# Patient Record
Sex: Male | Born: 1970 | Race: Black or African American | Hispanic: No | Marital: Single | Smoking: Never smoker
Health system: Southern US, Community
[De-identification: ages and names within clinical notes are randomized; demographics above are authoritative.]

## PROBLEM LIST (undated history)

## (undated) DIAGNOSIS — W3400XA Accidental discharge from unspecified firearms or gun, initial encounter: Secondary | ICD-10-CM

## (undated) DIAGNOSIS — F4310 Post-traumatic stress disorder, unspecified: Secondary | ICD-10-CM

## (undated) DIAGNOSIS — R29818 Other symptoms and signs involving the nervous system: Secondary | ICD-10-CM

## (undated) HISTORY — PX: NO PAST SURGERIES: SHX2092

---

## 1998-10-14 ENCOUNTER — Emergency Department (HOSPITAL_COMMUNITY): Admission: EM | Admit: 1998-10-14 | Discharge: 1998-10-15 | Payer: Self-pay | Admitting: Emergency Medicine

## 1999-02-18 ENCOUNTER — Emergency Department (HOSPITAL_COMMUNITY): Admission: EM | Admit: 1999-02-18 | Discharge: 1999-02-19 | Payer: Self-pay | Admitting: Emergency Medicine

## 1999-02-20 ENCOUNTER — Emergency Department (HOSPITAL_COMMUNITY): Admission: EM | Admit: 1999-02-20 | Discharge: 1999-02-21 | Payer: Self-pay | Admitting: Emergency Medicine

## 1999-02-21 ENCOUNTER — Encounter: Payer: Self-pay | Admitting: Emergency Medicine

## 1999-02-21 ENCOUNTER — Emergency Department (HOSPITAL_COMMUNITY): Admission: EM | Admit: 1999-02-21 | Discharge: 1999-02-21 | Payer: Self-pay

## 2009-06-26 ENCOUNTER — Inpatient Hospital Stay (HOSPITAL_COMMUNITY): Admission: EM | Admit: 2009-06-26 | Discharge: 2009-06-28 | Payer: Self-pay | Admitting: Emergency Medicine

## 2010-07-13 IMAGING — CR DG ABD PORTABLE 1V
1 series · 1 of 1 positions shown · non-contrast
Comparison: None

CLINICAL DATA: Overdose.

ABDOMEN - 1 VIEW

[view not recorded]
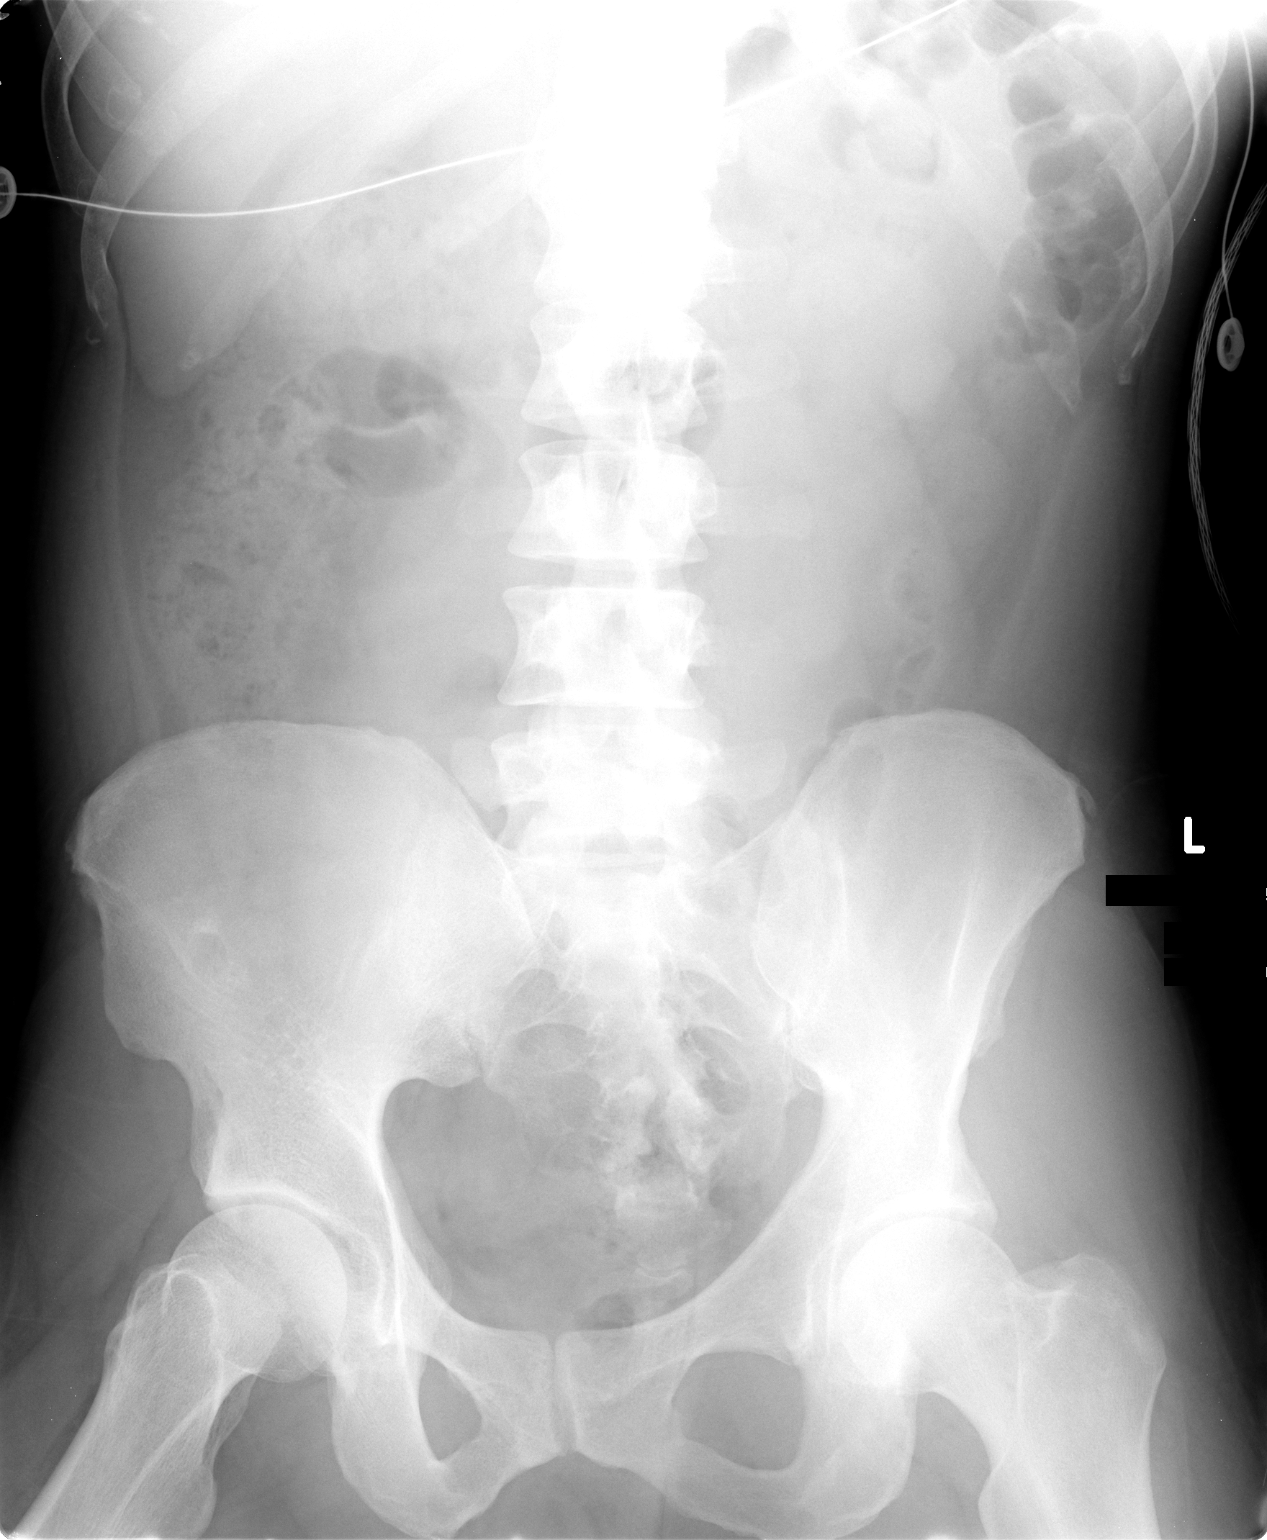

[1 of 1 positions shown; findings below may reference images not displayed]

FINDINGS: There is a normal bowel gas pattern.  No radiopaque
densities in the abdomen.  No supine evidence of free air.  No
organomegaly or suspicious calcification.
IMPRESSION: No acute findings.  No radiopaque foreign bodies visualized.

## 2011-02-06 LAB — BLOOD GAS, ARTERIAL
Acid-Base Excess: 0.5 mmol/L (ref 0.0–2.0)
Acid-base deficit: 0.8 mmol/L (ref 0.0–2.0)
Bicarbonate: 23.5 mEq/L (ref 20.0–24.0)
Bicarbonate: 25.1 mEq/L — ABNORMAL HIGH (ref 20.0–24.0)
Bicarbonate: 25.3 mEq/L — ABNORMAL HIGH (ref 20.0–24.0)
Bicarbonate: 26.2 mEq/L — ABNORMAL HIGH (ref 20.0–24.0)
Drawn by: 129801
Drawn by: 317771
FIO2: 0.21 %
FIO2: 0.21 %
O2 Saturation: 92.6 %
O2 Saturation: 92.8 %
Patient temperature: 98.6
Patient temperature: 98.6
Patient temperature: 98.6
TCO2: 22.5 mmol/L (ref 0–100)
TCO2: 23.7 mmol/L (ref 0–100)
pCO2 arterial: 43.5 mmHg (ref 35.0–45.0)
pCO2 arterial: 43.7 mmHg (ref 35.0–45.0)
pH, Arterial: 7.315 — ABNORMAL LOW (ref 7.350–7.450)
pH, Arterial: 7.38 (ref 7.350–7.450)
pO2, Arterial: 103 mmHg — ABNORMAL HIGH (ref 80.0–100.0)
pO2, Arterial: 76.2 mmHg — ABNORMAL LOW (ref 80.0–100.0)

## 2011-02-06 LAB — POCT I-STAT, CHEM 8
BUN: 9 mg/dL (ref 6–23)
Calcium, Ion: 1.12 mmol/L (ref 1.12–1.32)
Chloride: 102 mEq/L (ref 96–112)
Glucose, Bld: 152 mg/dL — ABNORMAL HIGH (ref 70–99)
HCT: 47 % (ref 39.0–52.0)
Potassium: 3 mEq/L — ABNORMAL LOW (ref 3.5–5.1)

## 2011-02-06 LAB — ACETAMINOPHEN LEVEL: Acetaminophen (Tylenol), Serum: <10 ug/mL — ABNORMAL LOW (ref 10–30)

## 2011-02-06 LAB — ETHANOL: Alcohol, Ethyl (B): <5 mg/dL (ref 0–10)

## 2011-02-06 LAB — COMPREHENSIVE METABOLIC PANEL
ALT: 52 U/L (ref 0–53)
AST: 24 U/L (ref 0–37)
Albumin: 3.4 g/dL — ABNORMAL LOW (ref 3.5–5.2)
Albumin: 4.5 g/dL (ref 3.5–5.2)
Alkaline Phosphatase: 48 U/L (ref 39–117)
BUN: 6 mg/dL (ref 6–23)
BUN: 8 mg/dL (ref 6–23)
BUN: 9 mg/dL (ref 6–23)
CO2: 27 mEq/L (ref 19–32)
Calcium: 8.7 mg/dL (ref 8.4–10.5)
Calcium: 9.2 mg/dL (ref 8.4–10.5)
Chloride: 102 mEq/L (ref 96–112)
Chloride: 108 mEq/L (ref 96–112)
Creatinine, Ser: 1 mg/dL (ref 0.4–1.5)
Creatinine, Ser: 1.07 mg/dL (ref 0.4–1.5)
GFR calc Af Amer: >60 mL/min (ref >60)
GFR calc non Af Amer: >60 mL/min (ref >60)
Glucose, Bld: 101 mg/dL — ABNORMAL HIGH (ref 70–99)
Glucose, Bld: 155 mg/dL — ABNORMAL HIGH (ref 70–99)
Glucose, Bld: 157 mg/dL — ABNORMAL HIGH (ref 70–99)
Potassium: 4 mEq/L (ref 3.5–5.1)
Sodium: 136 mEq/L (ref 135–145)
Total Bilirubin: 1.3 mg/dL — ABNORMAL HIGH (ref 0.3–1.2)
Total Protein: 7.2 g/dL (ref 6.0–8.3)
Total Protein: 8 g/dL (ref 6.0–8.3)

## 2011-02-06 LAB — CBC
HCT: 40.3 % (ref 39.0–52.0)
HCT: 42.7 % (ref 39.0–52.0)
Hemoglobin: 12.5 g/dL — ABNORMAL LOW (ref 13.0–17.0)
Hemoglobin: 13.5 g/dL (ref 13.0–17.0)
Hemoglobin: 14.2 g/dL (ref 13.0–17.0)
MCHC: 33.2 g/dL (ref 30.0–36.0)
MCHC: 33.4 g/dL (ref 30.0–36.0)
MCV: 87.1 fL (ref 78.0–100.0)
Platelets: 231 10*3/uL (ref 150–400)
Platelets: 310 10*3/uL (ref 150–400)
RBC: 4.9 MIL/uL (ref 4.22–5.81)
RDW: 14.8 % (ref 11.5–15.5)
RDW: 15.1 % (ref 11.5–15.5)
RDW: 15.2 % (ref 11.5–15.5)
WBC: 15 10*3/uL — ABNORMAL HIGH (ref 4.0–10.5)

## 2011-02-06 LAB — PROTIME-INR
INR: 1 (ref 0.00–1.49)
INR: 1.1 (ref 0.00–1.49)
Prothrombin Time: 12.9 seconds (ref 11.6–15.2)
Prothrombin Time: 13.1 seconds (ref 11.6–15.2)

## 2011-02-06 LAB — APTT: aPTT: 27 seconds (ref 24–37)

## 2011-02-06 LAB — URINE CULTURE
Colony Count: NO GROWTH
Culture: NO GROWTH

## 2011-02-06 LAB — RAPID URINE DRUG SCREEN, HOSP PERFORMED
Amphetamines: NOT DETECTED
Barbiturates: NOT DETECTED
Opiates: POSITIVE — AB

## 2011-02-06 LAB — T4, FREE: Free T4: 1.03 ng/dL (ref 0.80–1.80)

## 2011-02-06 LAB — URINE MICROSCOPIC-ADD ON

## 2011-02-06 LAB — URINALYSIS, ROUTINE W REFLEX MICROSCOPIC
Hgb urine dipstick: NEGATIVE
Nitrite: NEGATIVE
Protein, ur: 30 mg/dL — AB
Specific Gravity, Urine: 1.027 (ref 1.005–1.030)
Urobilinogen, UA: 1 mg/dL (ref 0.0–1.0)

## 2011-02-06 LAB — GLUCOSE, CAPILLARY

## 2011-02-06 LAB — DIFFERENTIAL
Basophils Absolute: 0.1 10*3/uL (ref 0.0–0.1)
Basophils Relative: 1 % (ref 0–1)
Lymphocytes Relative: 37 % (ref 12–46)
Monocytes Absolute: 1.1 10*3/uL — ABNORMAL HIGH (ref 0.1–1.0)
Neutro Abs: 8.3 10*3/uL — ABNORMAL HIGH (ref 1.7–7.7)
Neutrophils Relative %: 55 % (ref 43–77)

## 2011-02-06 LAB — MAGNESIUM: Magnesium: 1.8 mg/dL (ref 1.5–2.5)

## 2011-03-16 NOTE — Discharge Summary (Signed)
[name], [name]             ACCOUNT NO.:  [id number]   MEDICAL RECORD NO.:  [id number]          PATIENT TYPE:  INP   LOCATION:  1239                         FACILITY:  [facility]   PHYSICIAN:  Beckey Rutter, MD  DATE OF BIRTH:  06-Feb-1971   DATE OF ADMISSION:  06/26/2009  DATE OF DISCHARGE:                               DISCHARGE SUMMARY   CHIEF COMPLAINT:  The patient took too many pills.   BRIEF HISTORY OF PRESENT ILLNESS:  This is a 40 year old pleasant  gentleman with no significant past medical history who presented to the  emergency department early on the morning of June 26, 2009, because he  took a handful of medication including oxycodone, hydrocodone and  Vicoprofen.   HOSPITAL COURSE:  During hospital course, the patient was admitted to  ICU/step-down for the intensity of his symptoms and confusion.  He  received IV Narcan and Mucomyst intravenously as per the pharmacy.  He  had a Foley catheter placed and he monitored closely in the step-down  setting.  The patient showed improvement and his Narcan and Mucomyst  were tapered off and discontinued.  Since yesterday, the patient was  alert, oriented and he had a consultation done with Dr. Antonietta Breach.  It was concluded that the patient currently is not suicidal.  He regret  what he did.  He has good plans for the near future and for the long-  term.  The patient does not have any risk of harming himself currently  or others.  It is recommended by the psychiatrist to inpatient admission  but voluntary.   I discussed with the patient his medical problem as well as decision in  regard to his placement.  He wanted to be released home today and he  said he just did it for one time.   LABS:  As of today showing sodium 140, potassium 3.8, chloride 106,  bicarbonate 28, glucose 101, BUN is 8, creatinine 0.96.  PT is 1.0.  White blood count is 8.2, hemoglobin is 13.5, hematocrit is 40.3,  platelet count is 240.   DISCHARGE DIAGNOSES:  1. Overdose of oxycodone, hydrocodone.  2. Suicide ideation.  3. Adjustment disorder with mixed disturbance of emotion and conduct      versus [phone] mood disorder, not otherwise specified.   DISCHARGE MEDICATIONS:  The patient will be discharged without  prescription medication.   DISCHARGE/PLAN:  As discussed above, the patient was found to have  constructive regret plus good plans for the near and long-term future.  The patient is stable for discharge medically and psychiatrically.  Nevertheless, the  patient was advised to follow up with intensive outpatient psychiatry as  available.  No medication was prescribed at this time but this decision  was deferred to his psychiatrist.  He is aware and agreeable to  discharge plan and the follow-up plan.      Beckey Rutter, MD  Electronically Signed     EME/MEDQ  D:  06/28/2009  T:  06/28/2009  Job:  [phone]
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
## 2011-03-16 NOTE — Consult Note (Signed)
NAMENASER, SCHULD             ACCOUNT NO.:  [id number]   MEDICAL RECORD NO.:  [id number]          PATIENT TYPE:  INP   LOCATION:  1239                         [facility]ACILITY:  [facility]   PHYSICIAN:  Antonietta Breach, M.D.  DATE OF BIRTH:  [birth year]   DATE OF CONSULTATION:  06/27/2009  DATE OF DISCHARGE:  06/28/2009                                 CONSULTATION   REASON FOR CONSULTATION:  Overdose.   HISTORY OF PRESENT ILLNESS:  [name] is a 40 year old male  admitted to the [facility] on June 26, 2009 after an  overdose.   He overdosed on Oxycodone and some other medications.  He did  acknowledge the admitting physician that he was trying to kill himself.   He quickly regretted his overdose and has continued to have no suicidal  ideation since the overdose.   He drinks about his children as well as his family and has motivation to  improve his life condition.   He also has hope now.  He is not having any hallucinations or delusions.  He describes normal interests and constructive future goals.  His  orientation and memory function are intact.  He is socially appropriate  and cooperative.   He describes a number of stressors that have been occurring.  He has not  been able to remain employed.  Also he has had to give his children over  to foster care.   Recently his brother, who had been appointed a foster parent, had  temporarily lost custody of the children but then regained them.  The  patient realizes that he has been having some excessive worry.  It is  noted that [name] brought himself to emergency services.   PAST PSYCHIATRIC HISTORY:  No history of prior suicide attempts.  No  history of mania.   [name] did undergo counseling in the form of anger management.   FAMILY PSYCHIATRIC HISTORY:  None known   SOCIAL HISTORY:  As mentioned [name] has not been able to gain  employment.  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   He realizes that he has more social support now after the number of  conversations that have occurred since he has been in the hospital.  His  father has offered residence at his home.   PAST MEDICAL HISTORY:  History of nasal fracture.   MENTAL STATUS EXAM:  [name] is alert.  He is oriented to all  spheres.  His eye contact is good.  His attention span is normal.  Concentration is normal.  His affect is broad and appropriate.  Mood is  within normal limits.  He is oriented to all spheres.  His memory  function is intact to immediate, recent, and remote.  His fund of  knowledge and intelligence are within normal limits.   Speech involves normal rate and prosody without dysarthria.  Thought  process is logical, coherent, goal-directed.  No looseness of  associations.  Thought content:  No thoughts of harming himself.  No  thoughts of harming  others.  No delusions or hallucinations.  His  insight is intact.  His judgment is intact.   ASSESSMENT:  AXIS I:  Adjustment disorder with mixed disturbance of  emotions and conduct versus, 293.83, mood disorder, not otherwise  specified.  AXIS II:  Deferred.  AXIS III:  Status post polysubstance overdose.  AXIS IV:  Economic, primary support group.  AXIS V:  55.   [name] is no longer at risk to harm himself.  He is not at risk  to harm others.  He does agree to call emergency services immediately  for any thoughts of harming himself, thoughts of harming others. or  distress.   The undersigned did recommend that [name] be admitted to an  inpatient psychiatric unit for further evaluation, given his overdose  and the relative scarcity of outpatient psychiatric follow-up resources.   The patient understands and wants to consider the recommendation.   [name] is not committable now that he has recovered from his  acute emotional crisis.   The inpatient psychiatric course  could provide increased coping skills  and stress management.   As mentioned [name] is not committable.  The recommended  admission would be voluntary.   If he declines psychiatric admission, would ask the social worker to set  [facility] as soon as  available.      Antonietta Breach, M.D.  Electronically Signed     JW/MEDQ  D:  06/29/2009  T:  06/29/2009  Job:  [id number]

## 2012-09-09 ENCOUNTER — Encounter (HOSPITAL_COMMUNITY): Payer: Self-pay | Admitting: Anesthesiology

## 2012-09-09 ENCOUNTER — Ambulatory Visit (HOSPITAL_COMMUNITY)
Admission: EM | Admit: 2012-09-09 | Discharge: 2012-09-09 | Disposition: A | Discharge status: Home / Self Care | Payer: Self-pay | Attending: Emergency Medicine | Admitting: Emergency Medicine

## 2012-09-09 ENCOUNTER — Encounter (HOSPITAL_COMMUNITY)
Admission: EM | Disposition: A | Discharge status: Home / Self Care | Payer: Self-pay | Source: Home / Self Care | Attending: Emergency Medicine

## 2012-09-09 ENCOUNTER — Encounter (HOSPITAL_COMMUNITY): Payer: Self-pay | Admitting: Emergency Medicine

## 2012-09-09 ENCOUNTER — Observation Stay (HOSPITAL_COMMUNITY): Payer: Self-pay | Admitting: Anesthesiology

## 2012-09-09 DIAGNOSIS — S31109A Unspecified open wound of abdominal wall, unspecified quadrant without penetration into peritoneal cavity, initial encounter: Secondary | ICD-10-CM | POA: Insufficient documentation

## 2012-09-09 DIAGNOSIS — S31119A Laceration without foreign body of abdominal wall, unspecified quadrant without penetration into peritoneal cavity, initial encounter: Secondary | ICD-10-CM

## 2012-09-09 HISTORY — PX: LAPAROSCOPIC ABDOMINAL EXPLORATION: SHX6249

## 2012-09-09 LAB — POCT I-STAT, CHEM 8
Calcium, Ion: 1.17 mmol/L (ref 1.12–1.23)
Creatinine, Ser: 1 mg/dL (ref 0.50–1.35)
Glucose, Bld: 107 mg/dL — ABNORMAL HIGH (ref 70–99)
Hemoglobin: 16.7 g/dL (ref 13.0–17.0)
Sodium: 143 mEq/L (ref 135–145)
TCO2: 23 mmol/L (ref 0–100)

## 2012-09-09 LAB — POCT I-STAT EG7
Calcium, Ion: 1.27 mmol/L — ABNORMAL HIGH (ref 1.12–1.23)
HCT: 47 % (ref 39.0–52.0)
Hemoglobin: 16 g/dL (ref 13.0–17.0)
Potassium: 4 mEq/L (ref 3.5–5.1)
pCO2, Ven: 48.9 mmHg (ref 45.0–50.0)
pH, Ven: 7.393 — ABNORMAL HIGH (ref 7.250–7.300)
pO2, Ven: 19 mmHg — CL (ref 30.0–45.0)

## 2012-09-09 LAB — TYPE AND SCREEN
ABO/RH(D): A POS
Antibody Screen: NEGATIVE
Unit division: 0

## 2012-09-09 LAB — POCT I-STAT 4, (NA,K, GLUC, HGB,HCT)
Hemoglobin: 17.7 g/dL — ABNORMAL HIGH (ref 13.0–17.0)
Sodium: 169 mEq/L (ref 135–145)

## 2012-09-09 SURGERY — EXPLORATION, ABDOMEN, LAPAROSCOPIC
Anesthesia: General | Site: Abdomen | Wound class: Clean

## 2012-09-09 MED ORDER — SUCCINYLCHOLINE CHLORIDE 20 MG/ML IJ SOLN
INTRAMUSCULAR | Status: DC | PRN
  Administered 2012-09-09: 120 mg via INTRAVENOUS

## 2012-09-09 MED ORDER — OXYCODONE HCL 5 MG PO TABS
5.0000 mg | ORAL_TABLET | ORAL | Status: DC | PRN
  Administered 2012-09-09: 5 mg via ORAL

## 2012-09-09 MED ORDER — BUPIVACAINE HCL 0.25 % IJ SOLN
INTRAMUSCULAR | Status: DC | PRN
  Administered 2012-09-09: 1 mL

## 2012-09-09 MED ORDER — NEOSTIGMINE METHYLSULFATE 1 MG/ML IJ SOLN
INTRAMUSCULAR | Status: DC | PRN
  Administered 2012-09-09: 2 mg via INTRAVENOUS

## 2012-09-09 MED ORDER — SODIUM CHLORIDE 0.9 % IJ SOLN: 3.0000 mL | Freq: Two times a day (BID) | INTRAMUSCULAR | Status: DC

## 2012-09-09 MED ORDER — SODIUM CHLORIDE 0.9 % IV SOLN
1.0000 g | Freq: Once | INTRAVENOUS | Status: DC
  Filled 2012-09-09: qty 1

## 2012-09-09 MED ORDER — GLYCOPYRROLATE 0.2 MG/ML IJ SOLN
INTRAMUSCULAR | Status: DC | PRN
  Administered 2012-09-09: 0.4 mg via INTRAVENOUS

## 2012-09-09 MED ORDER — SODIUM CHLORIDE 0.9 % IJ SOLN: 3.0000 mL | INTRAMUSCULAR | Status: DC | PRN

## 2012-09-09 MED ORDER — VECURONIUM BROMIDE 10 MG IV SOLR
INTRAVENOUS | Status: DC | PRN
  Administered 2012-09-09: 2 mg via INTRAVENOUS

## 2012-09-09 MED ORDER — PROPOFOL 10 MG/ML IV BOLUS
INTRAVENOUS | Status: DC | PRN
  Administered 2012-09-09: 170 mg via INTRAVENOUS

## 2012-09-09 MED ORDER — SODIUM CHLORIDE 0.9 % IV SOLN: 250.0000 mL | INTRAVENOUS | Status: DC | PRN

## 2012-09-09 MED ORDER — ACETAMINOPHEN 650 MG RE SUPP
650.0000 mg | RECTAL | Status: DC | PRN
  Filled 2012-09-09: qty 1

## 2012-09-09 MED ORDER — 0.9 % SODIUM CHLORIDE (POUR BTL) OPTIME
TOPICAL | Status: DC | PRN
  Administered 2012-09-09: 100 mL

## 2012-09-09 MED ORDER — ONDANSETRON HCL 4 MG/2ML IJ SOLN
4.0000 mg | Freq: Four times a day (QID) | INTRAMUSCULAR | Status: DC | PRN
  Administered 2012-09-09: 4 mg via INTRAVENOUS
  Filled 2012-09-09: qty 2

## 2012-09-09 MED ORDER — LIDOCAINE HCL (CARDIAC) 20 MG/ML IV SOLN
INTRAVENOUS | Status: DC | PRN
  Administered 2012-09-09: 60 mg via INTRAVENOUS

## 2012-09-09 MED ORDER — FENTANYL CITRATE 0.05 MG/ML IJ SOLN
INTRAMUSCULAR | Status: DC | PRN
  Administered 2012-09-09: 50 ug via INTRAVENOUS
  Administered 2012-09-09 (×2): 100 ug via INTRAVENOUS

## 2012-09-09 MED ORDER — ACETAMINOPHEN 325 MG PO TABS
650.0000 mg | ORAL_TABLET | ORAL | Status: DC | PRN
  Filled 2012-09-09: qty 2

## 2012-09-09 MED ORDER — MORPHINE SULFATE 2 MG/ML IJ SOLN
2.0000 mg | INTRAMUSCULAR | Status: DC | PRN
  Administered 2012-09-09: 2 mg via INTRAVENOUS

## 2012-09-09 MED ORDER — LACTATED RINGERS IV SOLN
INTRAVENOUS | Status: DC | PRN
  Administered 2012-09-09 (×2): via INTRAVENOUS

## 2012-09-09 SURGICAL SUPPLY — 22 items
CHLORAPREP W/TINT 26ML (MISCELLANEOUS) ×1 IMPLANT
CLOTH BEACON ORANGE TIMEOUT ST (SAFETY) ×1 IMPLANT
COVER SURGICAL LIGHT HANDLE (MISCELLANEOUS) ×1 IMPLANT
CUTTER LINEAR ENDO 35 ART THIN (STAPLE) ×1 IMPLANT
ELECT REM PT RETURN 9FT ADLT (ELECTROSURGICAL) ×2
ELECTRODE REM PT RTRN 9FT ADLT (ELECTROSURGICAL) IMPLANT
GAUZE SPONGE 4X4 16PLY XRAY LF (GAUZE/BANDAGES/DRESSINGS) ×1 IMPLANT
GLOVE BIOGEL PI IND STRL 8 (GLOVE) IMPLANT
GLOVE BIOGEL PI INDICATOR 8 (GLOVE) ×1
GLOVE ECLIPSE 8.0 STRL XLNG CF (GLOVE) ×1 IMPLANT
GOWN STRL NON-REIN LRG LVL3 (GOWN DISPOSABLE) ×2 IMPLANT
KIT BASIN OR (CUSTOM PROCEDURE TRAY) ×2 IMPLANT
KIT ROOM TURNOVER OR (KITS) ×1 IMPLANT
NDL HYPO 25X1 1.5 SAFETY (NEEDLE) IMPLANT
NEEDLE HYPO 25X1 1.5 SAFETY (NEEDLE) ×2 IMPLANT
NS IRRIG 1000ML POUR BTL (IV SOLUTION) ×1 IMPLANT
PAD ARMBOARD 7.5X6 YLW CONV (MISCELLANEOUS) ×1 IMPLANT
SPONGE GAUZE 4X4 12PLY (GAUZE/BANDAGES/DRESSINGS) ×1 IMPLANT
TAPE CLOTH SURG 6X10 WHT LF (GAUZE/BANDAGES/DRESSINGS) ×1 IMPLANT
TOWEL OR 17X26 10 PK STRL BLUE (TOWEL DISPOSABLE) ×1 IMPLANT
TRAY LAPAROSCOPIC (CUSTOM PROCEDURE TRAY) ×1 IMPLANT
TROCAR XCEL NON-BLD 5MMX100MML (ENDOMECHANICALS) ×1 IMPLANT
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
## 2012-09-09 NOTE — Anesthesia Preprocedure Evaluation (Addendum)
Anesthesia Evaluation  Patient identified by MRN, date of birth, ID band Patient awake    Reviewed: Allergy & Precautions, H&P , NPO status , Patient's Chart, lab work & pertinent test results  Airway Mallampati: II TM Distance: >3 FB Neck ROM: Full    Dental  (+) Teeth Intact and Dental Advisory Given,    Pulmonary  breath sounds clear to auscultation        Cardiovascular Rhythm:Regular Rate:Normal     Neuro/Psych    GI/Hepatic   Endo/Other    Renal/GU      Musculoskeletal   Abdominal (+)  Abdomen: tender.    Peds  Hematology   Anesthesia Other Findings   Reproductive/Obstetrics                          Anesthesia Physical Anesthesia Plan  ASA: III and emergent  Anesthesia Plan: General   Post-op Pain Management:    Induction: Intravenous  Airway Management Planned: Oral ETT  Additional Equipment:   Intra-op Plan:   Post-operative Plan: Extubation in OR  Informed Consent: I have reviewed the patients History and Physical, chart, labs and discussed the procedure including the risks, benefits and alternatives for the proposed anesthesia with the patient or authorized representative who has indicated his/her understanding and acceptance.   Dental advisory given  Plan Discussed with: CRNA and Surgeon  Anesthesia Plan Comments:         Anesthesia Quick Evaluation

## 2012-09-09 NOTE — Transfer of Care (Signed)
Immediate Anesthesia Transfer of Care Note  Patient: [name]  Procedure(s) Performed: Procedure(s) (LRB) with comments: LAPAROSCOPIC ABDOMINAL EXPLORATION (N/A)  Patient Location: PACU  Anesthesia Type:General  Level of Consciousness: sedated  Airway & Oxygen Therapy: Patient Spontanous Breathing and Patient connected to nasal cannula oxygen  Post-op Assessment: Report given to PACU RN and Post -op Vital signs reviewed and stable  Post vital signs: Reviewed and stable  Complications: No apparent anesthesia complications

## 2012-09-09 NOTE — Anesthesia Procedure Notes (Signed)
Procedure Name: Intubation Date/Time: 09/09/2012 4:05 PM Performed by: Alanda Amass A Pre-anesthesia Checklist: Patient identified, Timeout performed, Emergency Drugs available, Suction available and Patient being monitored Patient Re-evaluated:Patient Re-evaluated prior to inductionOxygen Delivery Method: Circle system utilized Preoxygenation: Pre-oxygenation with 100% oxygen Intubation Type: IV induction, Rapid sequence and Cricoid Pressure applied Laryngoscope Size: Mac and 3 Grade View: Grade II Tube type: Oral Tube size: 7.5 mm Number of attempts: 1 Airway Equipment and Method: Stylet Placement Confirmation: ETT inserted through vocal cords under direct vision,  breath sounds checked- equal and bilateral and positive ETCO2 Secured at: 21 cm Tube secured with: Tape Dental Injury: Teeth and Oropharynx as per pre-operative assessment

## 2012-09-09 NOTE — ED Notes (Signed)
Mom phone number [phone]

## 2012-09-09 NOTE — Anesthesia Postprocedure Evaluation (Signed)
  Anesthesia Post-op Note  Patient: [name]  Procedure(s) Performed: Procedure(s) (LRB) with comments: LAPAROSCOPIC ABDOMINAL EXPLORATION (N/A)  Patient Location: PACU  Anesthesia Type:General  Level of Consciousness: awake, alert  and oriented  Airway and Oxygen Therapy: Patient Spontanous Breathing  Post-op Pain: none  Post-op Assessment: Post-op Vital signs reviewed  Post-op Vital Signs: stable  Complications: No apparent anesthesia complications
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
## 2012-09-09 NOTE — ED Notes (Signed)
Pt placed in a gown, on monitor, continuous pulse oximetry and blood pressure cuff
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
## 2012-09-10 ENCOUNTER — Encounter (HOSPITAL_COMMUNITY): Payer: Self-pay

## 2012-09-10 ENCOUNTER — Emergency Department (HOSPITAL_COMMUNITY)
Admission: EM | Admit: 2012-09-10 | Discharge: 2012-09-10 | Disposition: A | Discharge status: Home / Self Care | Payer: Self-pay | Attending: Emergency Medicine | Admitting: Emergency Medicine

## 2012-09-10 DIAGNOSIS — R109 Unspecified abdominal pain: Secondary | ICD-10-CM | POA: Insufficient documentation

## 2012-09-10 MED ORDER — OXYCODONE-ACETAMINOPHEN 5-325 MG PO TABS: 1.0000 | ORAL_TABLET | Freq: Four times a day (QID) | ORAL | Status: DC | PRN
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
## 2012-09-11 ENCOUNTER — Encounter (HOSPITAL_COMMUNITY): Payer: Self-pay | Admitting: General Surgery

## 2012-09-29 MED ORDER — MIDAZOLAM HCL 5 MG/5ML IJ SOLN
INTRAMUSCULAR | Status: DC | PRN
  Administered 2012-09-09: 2 mg via INTRAVENOUS

## 2012-09-29 NOTE — Addendum Note (Signed)
Addendum  created 09/29/12 [id number] by Atilano Ina, CRNA   Modules edited:Anesthesia Medication Administration

## 2015-06-02 ENCOUNTER — Ambulatory Visit: Payer: Self-pay | Admitting: Podiatry

## 2015-06-10 ENCOUNTER — Ambulatory Visit: Payer: Self-pay | Admitting: Podiatry

## 2015-08-03 DIAGNOSIS — W3400XA Accidental discharge from unspecified firearms or gun, initial encounter: Secondary | ICD-10-CM

## 2015-08-03 HISTORY — DX: Accidental discharge from unspecified firearms or gun, initial encounter: W34.00XA

## 2015-08-04 ENCOUNTER — Emergency Department (HOSPITAL_COMMUNITY): Payer: Medicaid Other

## 2015-08-04 ENCOUNTER — Observation Stay (HOSPITAL_COMMUNITY)
Admission: EM | Admit: 2015-08-04 | Discharge: 2015-08-05 | Disposition: A | Discharge status: Home / Self Care | Payer: Medicaid Other | Attending: General Surgery | Admitting: General Surgery

## 2015-08-04 ENCOUNTER — Encounter (HOSPITAL_COMMUNITY): Payer: Self-pay | Admitting: *Deleted

## 2015-08-04 ENCOUNTER — Observation Stay (HOSPITAL_COMMUNITY): Payer: Medicaid Other

## 2015-08-04 DIAGNOSIS — T148 Other injury of unspecified body region: Secondary | ICD-10-CM | POA: Diagnosis present

## 2015-08-04 DIAGNOSIS — D62 Acute posthemorrhagic anemia: Secondary | ICD-10-CM | POA: Diagnosis not present

## 2015-08-04 DIAGNOSIS — K920 Hematemesis: Secondary | ICD-10-CM

## 2015-08-04 DIAGNOSIS — S42101A Fracture of unspecified part of scapula, right shoulder, initial encounter for closed fracture: Secondary | ICD-10-CM | POA: Diagnosis present

## 2015-08-04 DIAGNOSIS — S42101B Fracture of unspecified part of scapula, right shoulder, initial encounter for open fracture: Secondary | ICD-10-CM | POA: Diagnosis not present

## 2015-08-04 DIAGNOSIS — S20459A Superficial foreign body of unspecified back wall of thorax, initial encounter: Secondary | ICD-10-CM | POA: Diagnosis present

## 2015-08-04 DIAGNOSIS — S21249A Puncture wound with foreign body of unspecified back wall of thorax without penetration into thoracic cavity, initial encounter: Secondary | ICD-10-CM | POA: Diagnosis not present

## 2015-08-04 DIAGNOSIS — W3400XA Accidental discharge from unspecified firearms or gun, initial encounter: Secondary | ICD-10-CM | POA: Diagnosis not present

## 2015-08-04 HISTORY — DX: Accidental discharge from unspecified firearms or gun, initial encounter: W34.00XA

## 2015-08-04 LAB — TYPE AND SCREEN
ABO/RH(D): A POS
ANTIBODY SCREEN: NEGATIVE
UNIT DIVISION: 0
Unit division: 0

## 2015-08-04 LAB — PREPARE FRESH FROZEN PLASMA
Unit division: 0
Unit division: 0

## 2015-08-04 LAB — COMPREHENSIVE METABOLIC PANEL
ALBUMIN: 3.5 g/dL (ref 3.5–5.0)
ALK PHOS: 43 U/L (ref 38–126)
ALT: 76 U/L — ABNORMAL HIGH (ref 17–63)
AST: 63 U/L — AB (ref 15–41)
Anion gap: 21 — ABNORMAL HIGH (ref 5–15)
BILIRUBIN TOTAL: 0.5 mg/dL (ref 0.3–1.2)
BUN: 10 mg/dL (ref 6–20)
CALCIUM: 8.9 mg/dL (ref 8.9–10.3)
CO2: 16 mmol/L — ABNORMAL LOW (ref 22–32)
CREATININE: 1.35 mg/dL — AB (ref 0.61–1.24)
Chloride: 103 mmol/L (ref 101–111)
GFR calc Af Amer: >60 mL/min (ref >60)
GFR calc non Af Amer: >60 mL/min (ref >60)
GLUCOSE: 201 mg/dL — AB (ref 65–99)
Potassium: 3 mmol/L — ABNORMAL LOW (ref 3.5–5.1)
Sodium: 140 mmol/L (ref 135–145)
TOTAL PROTEIN: 6.7 g/dL (ref 6.5–8.1)

## 2015-08-04 LAB — CDS SEROLOGY

## 2015-08-04 LAB — CBC
HCT: 42.8 % (ref 39.0–52.0)
Hemoglobin: 14.1 g/dL (ref 13.0–17.0)
MCH: 28.6 pg (ref 26.0–34.0)
MCHC: 32.9 g/dL (ref 30.0–36.0)
MCV: 86.8 fL (ref 78.0–100.0)
Platelets: 269 10*3/uL (ref 150–400)
RBC: 4.93 MIL/uL (ref 4.22–5.81)
RDW: 14.2 % (ref 11.5–15.5)
WBC: 15.4 10*3/uL — ABNORMAL HIGH (ref 4.0–10.5)

## 2015-08-04 LAB — PROTIME-INR
INR: 1.09 (ref 0.00–1.49)
PROTHROMBIN TIME: 14.3 s (ref 11.6–15.2)

## 2015-08-04 LAB — ETHANOL

## 2015-08-04 LAB — ABO/RH: ABO/RH(D): A POS

## 2015-08-04 MED ORDER — LIDOCAINE HCL (PF) 2 % IJ SOLN
INTRAMUSCULAR | Status: AC
  Filled 2015-08-04: qty 2

## 2015-08-04 MED ORDER — OXYCODONE HCL 5 MG PO TABS: 5.0000 mg | ORAL_TABLET | ORAL | Status: DC | PRN

## 2015-08-04 MED ORDER — SODIUM CHLORIDE 0.9 % IV SOLN: INTRAVENOUS | Status: DC

## 2015-08-04 MED ORDER — HYDROMORPHONE HCL 1 MG/ML IJ SOLN
INTRAMUSCULAR | Status: AC
  Filled 2015-08-04: qty 1

## 2015-08-04 MED ORDER — CEFAZOLIN SODIUM-DEXTROSE 2-3 GM-% IV SOLR
INTRAVENOUS | Status: AC
  Administered 2015-08-04: 2000 mg
  Filled 2015-08-04: qty 50

## 2015-08-04 MED ORDER — NAPROXEN 250 MG PO TABS
500.0000 mg | ORAL_TABLET | Freq: Two times a day (BID) | ORAL | Status: DC
  Administered 2015-08-04 – 2015-08-05 (×2): 500 mg via ORAL
  Filled 2015-08-04 (×2): qty 2

## 2015-08-04 MED ORDER — HYDROMORPHONE HCL 1 MG/ML IJ SOLN
0.5000 mg | INTRAMUSCULAR | Status: DC | PRN
Start: 2015-08-04 — End: 2015-08-04

## 2015-08-04 MED ORDER — MORPHINE SULFATE (PF) 4 MG/ML IV SOLN: 4.0000 mg | INTRAVENOUS | Status: DC | PRN

## 2015-08-04 MED ORDER — POLYETHYLENE GLYCOL 3350 17 G PO PACK
17.0000 g | PACK | Freq: Every day | ORAL | Status: DC
  Administered 2015-08-05: 17 g via ORAL
  Filled 2015-08-04: qty 1

## 2015-08-04 MED ORDER — MORPHINE SULFATE (PF) 2 MG/ML IV SOLN
2.0000 mg | Freq: Once | INTRAVENOUS | Status: AC
  Administered 2015-08-04: 2 mg via INTRAVENOUS

## 2015-08-04 MED ORDER — HYDROMORPHONE HCL 1 MG/ML IJ SOLN: 1.0000 mg | INTRAMUSCULAR | Status: DC | PRN

## 2015-08-04 MED ORDER — OXYCODONE HCL 5 MG PO TABS: 2.5000 mg | ORAL_TABLET | ORAL | Status: DC | PRN

## 2015-08-04 MED ORDER — IOHEXOL 300 MG/ML  SOLN
100.0000 mL | Freq: Once | INTRAMUSCULAR | Status: AC | PRN
  Administered 2015-08-04: 100 mL via INTRAVENOUS

## 2015-08-04 MED ORDER — DOCUSATE SODIUM 100 MG PO CAPS
100.0000 mg | ORAL_CAPSULE | Freq: Two times a day (BID) | ORAL | Status: DC
  Administered 2015-08-04 – 2015-08-05 (×2): 100 mg via ORAL
  Filled 2015-08-04 (×2): qty 1

## 2015-08-04 MED ORDER — HYDROMORPHONE HCL 1 MG/ML IJ SOLN: 0.5000 mg | INTRAMUSCULAR | Status: DC | PRN

## 2015-08-04 MED ORDER — IOHEXOL 300 MG/ML  SOLN
150.0000 mL | Freq: Once | INTRAMUSCULAR | Status: DC | PRN
  Administered 2015-08-04: 50 mL via ORAL
  Filled 2015-08-04: qty 150

## 2015-08-04 MED ORDER — METHOCARBAMOL 750 MG PO TABS
1500.0000 mg | ORAL_TABLET | Freq: Four times a day (QID) | ORAL | Status: DC
  Administered 2015-08-04 – 2015-08-05 (×4): 1500 mg via ORAL
  Filled 2015-08-04 (×4): qty 2

## 2015-08-04 MED ORDER — MORPHINE SULFATE (PF) 2 MG/ML IV SOLN
INTRAVENOUS | Status: AC
  Administered 2015-08-04: 2 mg via INTRAMUSCULAR
  Filled 2015-08-04: qty 1

## 2015-08-04 MED ORDER — MORPHINE SULFATE (PF) 2 MG/ML IV SOLN
INTRAVENOUS | Status: AC
  Filled 2015-08-04: qty 1

## 2015-08-04 MED ORDER — BACITRACIN-NEOMYCIN-POLYMYXIN OINTMENT TUBE
TOPICAL_OINTMENT | Freq: Two times a day (BID) | CUTANEOUS | Status: DC
  Filled 2015-08-04: qty 15

## 2015-08-04 MED ORDER — HYDROMORPHONE HCL 1 MG/ML IJ SOLN
1.0000 mg | Freq: Once | INTRAMUSCULAR | Status: AC
  Administered 2015-08-04: 1 mg via INTRAVENOUS

## 2015-08-04 MED ORDER — OXYCODONE HCL 5 MG PO TABS
10.0000 mg | ORAL_TABLET | ORAL | Status: DC | PRN
  Administered 2015-08-04 – 2015-08-05 (×5): 10 mg via ORAL
  Filled 2015-08-04 (×5): qty 2

## 2015-08-04 MED ORDER — ONDANSETRON HCL 4 MG/2ML IJ SOLN
4.0000 mg | Freq: Four times a day (QID) | INTRAMUSCULAR | Status: DC | PRN
  Administered 2015-08-04: 4 mg via INTRAVENOUS
  Filled 2015-08-04: qty 2

## 2015-08-04 MED ORDER — ONDANSETRON HCL 4 MG PO TABS: 4.0000 mg | ORAL_TABLET | Freq: Four times a day (QID) | ORAL | Status: DC | PRN

## 2015-08-04 MED ORDER — INFLUENZA VAC SPLIT QUAD 0.5 ML IM SUSY
0.5000 mL | PREFILLED_SYRINGE | INTRAMUSCULAR | Status: DC
  Filled 2015-08-04: qty 0.5

## 2015-08-04 MED ORDER — HYDROMORPHONE HCL 1 MG/ML IJ SOLN
1.5000 mg | INTRAMUSCULAR | Status: DC | PRN
  Administered 2015-08-04: 1.5 mg via INTRAVENOUS
  Filled 2015-08-04: qty 2

## 2015-08-04 NOTE — ED Notes (Signed)
Report called 6n
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
## 2015-08-04 NOTE — ED Notes (Signed)
[facility]AMILY AT THE BEDSIDE

## 2015-08-04 NOTE — ED Notes (Signed)
[employer] AT THE BEDSIDE
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
## 2015-08-04 NOTE — ED Notes (Signed)
PT ALERT
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
## 2015-08-04 NOTE — ED Notes (Signed)
Morphine  iv per morgan brown rn
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
## 2015-08-04 NOTE — ED Notes (Signed)
Morphine  iv tet ancef 2 gms iv  Per morgan brown rn

## 2015-08-04 NOTE — ED Notes (Signed)
To ct
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
## 2015-08-05 DIAGNOSIS — D62 Acute posthemorrhagic anemia: Secondary | ICD-10-CM | POA: Diagnosis not present

## 2015-08-05 LAB — CBC
HCT: 36.7 % — ABNORMAL LOW (ref 39.0–52.0)
Hemoglobin: 12.4 g/dL — ABNORMAL LOW (ref 13.0–17.0)
MCH: 29 pg (ref 26.0–34.0)
MCHC: 33.8 g/dL (ref 30.0–36.0)
MCV: 85.7 fL (ref 78.0–100.0)
PLATELETS: 201 10*3/uL (ref 150–400)
RBC: 4.28 MIL/uL (ref 4.22–5.81)
RDW: 14.6 % (ref 11.5–15.5)
WBC: 11.1 10*3/uL — AB (ref 4.0–10.5)

## 2015-08-05 MED ORDER — OXYCODONE-ACETAMINOPHEN 5-325 MG PO TABS: 1.0000 | ORAL_TABLET | ORAL | Status: DC | PRN

## 2015-08-05 MED ORDER — METHOCARBAMOL 500 MG PO TABS: 500.0000 mg | ORAL_TABLET | Freq: Four times a day (QID) | ORAL | Status: DC | PRN

## 2015-08-05 MED ORDER — NAPROXEN 500 MG PO TABS: 500.0000 mg | ORAL_TABLET | Freq: Two times a day (BID) | ORAL | Status: DC

## 2015-08-05 NOTE — Discharge Instructions (Signed)
Wash wounds daily in shower with soap and water. °Do not soak. °Apply antibiotic ointment (e.g. Neosporin) twice daily and as needed to keep moist. °Cover with dry dressing. ° °No driving while taking oxycodone. °

## 2015-08-05 NOTE — Progress Notes (Signed)
OT NOTE  OT attempting session and pt has d/c home at this time.   Mateo Flow   OTR/L Pager: [phone] Office: [phone] .
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
## 2015-08-05 NOTE — Progress Notes (Signed)
PT Cancellation Note  Patient Details Name: GAINES CARTMELL MRN: [id number] DOB: 1970/11/18   Cancelled Treatment:    Reason Eval/Treat Not Completed: Patient declined, no reason specified.  Offered PT services for stair training full flight without IV, but pt feels that he can manage at home and is awaiting d/c and politely declined PT services.  Pt aware that if he changes mind before d/c to let PT know and she will work with him this AM.   Kingsley Callander LUBECK 08/05/2015, 9:41 AM
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
## 2015-08-06 ENCOUNTER — Encounter (HOSPITAL_COMMUNITY): Payer: Self-pay | Admitting: General Surgery

## 2015-10-10 ENCOUNTER — Inpatient Hospital Stay (HOSPITAL_COMMUNITY)
Admission: AD | Admit: 2015-10-10 | Discharge: 2015-10-15 | DRG: 885 | Disposition: A | Discharge status: Home / Self Care | Payer: Medicaid Other | Attending: Psychiatry | Admitting: Psychiatry

## 2015-10-10 ENCOUNTER — Encounter (HOSPITAL_COMMUNITY): Payer: Self-pay

## 2015-10-10 DIAGNOSIS — F419 Anxiety disorder, unspecified: Secondary | ICD-10-CM | POA: Diagnosis present

## 2015-10-10 DIAGNOSIS — F333 Major depressive disorder, recurrent, severe with psychotic symptoms: Secondary | ICD-10-CM | POA: Insufficient documentation

## 2015-10-10 DIAGNOSIS — F329 Major depressive disorder, single episode, unspecified: Secondary | ICD-10-CM

## 2015-10-10 DIAGNOSIS — Z811 Family history of alcohol abuse and dependence: Secondary | ICD-10-CM

## 2015-10-10 DIAGNOSIS — R45850 Homicidal ideations: Secondary | ICD-10-CM

## 2015-10-10 DIAGNOSIS — G4710 Hypersomnia, unspecified: Secondary | ICD-10-CM | POA: Diagnosis present

## 2015-10-10 DIAGNOSIS — F6089 Other specific personality disorders: Secondary | ICD-10-CM | POA: Diagnosis not present

## 2015-10-10 DIAGNOSIS — R45851 Suicidal ideations: Secondary | ICD-10-CM | POA: Diagnosis present

## 2015-10-10 DIAGNOSIS — F332 Major depressive disorder, recurrent severe without psychotic features: Principal | ICD-10-CM | POA: Diagnosis present

## 2015-10-10 DIAGNOSIS — G4700 Insomnia, unspecified: Secondary | ICD-10-CM | POA: Diagnosis present

## 2015-10-10 DIAGNOSIS — R4689 Other symptoms and signs involving appearance and behavior: Secondary | ICD-10-CM

## 2015-10-10 DIAGNOSIS — F32A Depression, unspecified: Secondary | ICD-10-CM

## 2015-10-10 HISTORY — DX: Post-traumatic stress disorder, unspecified: F43.10

## 2015-10-10 MED ORDER — HYDROXYZINE HCL 25 MG PO TABS: 25.0000 mg | ORAL_TABLET | Freq: Four times a day (QID) | ORAL | Status: DC | PRN

## 2015-10-10 MED ORDER — THIAMINE HCL 100 MG/ML IJ SOLN: 100.0000 mg | Freq: Once | INTRAMUSCULAR | Status: DC

## 2015-10-10 MED ORDER — ONDANSETRON 4 MG PO TBDP: 4.0000 mg | ORAL_TABLET | Freq: Four times a day (QID) | ORAL | Status: DC | PRN

## 2015-10-10 MED ORDER — ADULT MULTIVITAMIN W/MINERALS CH
1.0000 | ORAL_TABLET | Freq: Every day | ORAL | Status: DC
Start: 2015-10-10 — End: 2015-10-11
  Administered 2015-10-10 – 2015-10-11 (×2): 1 via ORAL
  Filled 2015-10-10 (×3): qty 1

## 2015-10-10 MED ORDER — VITAMIN B-1 100 MG PO TABS
ORAL_TABLET | ORAL | Status: AC
  Filled 2015-10-10: qty 1

## 2015-10-10 MED ORDER — LORAZEPAM 1 MG PO TABS: 1.0000 mg | ORAL_TABLET | Freq: Four times a day (QID) | ORAL | Status: DC | PRN

## 2015-10-10 MED ORDER — ACETAMINOPHEN 325 MG PO TABS: 650.0000 mg | ORAL_TABLET | Freq: Four times a day (QID) | ORAL | Status: DC | PRN

## 2015-10-10 MED ORDER — LOPERAMIDE HCL 2 MG PO CAPS: 2.0000 mg | ORAL_CAPSULE | ORAL | Status: DC | PRN

## 2015-10-10 MED ORDER — TRAZODONE HCL 50 MG PO TABS: 50.0000 mg | ORAL_TABLET | Freq: Every evening | ORAL | Status: DC | PRN

## 2015-10-10 MED ORDER — LORAZEPAM 1 MG PO TABS: 1.0000 mg | ORAL_TABLET | Freq: Three times a day (TID) | ORAL | Status: DC

## 2015-10-10 MED ORDER — MAGNESIUM HYDROXIDE 400 MG/5ML PO SUSP: 30.0000 mL | Freq: Every day | ORAL | Status: DC | PRN

## 2015-10-10 MED ORDER — LORAZEPAM 1 MG PO TABS: 1.0000 mg | ORAL_TABLET | Freq: Two times a day (BID) | ORAL | Status: DC

## 2015-10-10 MED ORDER — VITAMIN B-1 100 MG PO TABS
100.0000 mg | ORAL_TABLET | Freq: Every day | ORAL | Status: DC
  Administered 2015-10-10 – 2015-10-11 (×2): 100 mg via ORAL
  Filled 2015-10-10 (×2): qty 1

## 2015-10-10 MED ORDER — LORAZEPAM 1 MG PO TABS: 1.0000 mg | ORAL_TABLET | Freq: Four times a day (QID) | ORAL | Status: DC

## 2015-10-10 MED ORDER — ALUM & MAG HYDROXIDE-SIMETH 200-200-20 MG/5ML PO SUSP: 30.0000 mL | ORAL | Status: DC | PRN

## 2015-10-10 MED ORDER — LORAZEPAM 1 MG PO TABS: 1.0000 mg | ORAL_TABLET | Freq: Every day | ORAL | Status: DC

## 2015-10-10 MED ORDER — IBUPROFEN 600 MG PO TABS
600.0000 mg | ORAL_TABLET | ORAL | Status: DC | PRN
  Administered 2015-10-11: 600 mg via ORAL
  Filled 2015-10-10: qty 1

## 2015-10-10 MED ORDER — HYDROXYZINE HCL 50 MG PO TABS
50.0000 mg | ORAL_TABLET | Freq: Three times a day (TID) | ORAL | Status: DC
  Administered 2015-10-10 – 2015-10-11 (×3): 50 mg via ORAL
  Filled 2015-10-10 (×5): qty 1
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
## 2015-10-10 NOTE — BH Assessment (Signed)
[facility] Assessment Progress Note  Patient reports excessive stress this date and depression. Patient will be held in the Observation unit and re-evaluated in the a.m. to determine disposition.
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
## 2015-10-11 DIAGNOSIS — R45851 Suicidal ideations: Secondary | ICD-10-CM | POA: Diagnosis present

## 2015-10-11 DIAGNOSIS — G4700 Insomnia, unspecified: Secondary | ICD-10-CM | POA: Diagnosis present

## 2015-10-11 DIAGNOSIS — F329 Major depressive disorder, single episode, unspecified: Secondary | ICD-10-CM | POA: Diagnosis not present

## 2015-10-11 DIAGNOSIS — R45850 Homicidal ideations: Secondary | ICD-10-CM | POA: Diagnosis not present

## 2015-10-11 DIAGNOSIS — F419 Anxiety disorder, unspecified: Secondary | ICD-10-CM | POA: Diagnosis present

## 2015-10-11 DIAGNOSIS — G4710 Hypersomnia, unspecified: Secondary | ICD-10-CM | POA: Diagnosis present

## 2015-10-11 DIAGNOSIS — F6089 Other specific personality disorders: Secondary | ICD-10-CM | POA: Diagnosis not present

## 2015-10-11 DIAGNOSIS — F332 Major depressive disorder, recurrent severe without psychotic features: Secondary | ICD-10-CM | POA: Diagnosis not present

## 2015-10-11 DIAGNOSIS — Z811 Family history of alcohol abuse and dependence: Secondary | ICD-10-CM | POA: Diagnosis not present

## 2015-10-11 MED ORDER — TRAZODONE HCL 50 MG PO TABS: 50.0000 mg | ORAL_TABLET | Freq: Every evening | ORAL | Status: DC | PRN

## 2015-10-11 MED ORDER — ONDANSETRON 4 MG PO TBDP: 4.0000 mg | ORAL_TABLET | Freq: Four times a day (QID) | ORAL | Status: AC | PRN

## 2015-10-11 MED ORDER — ADULT MULTIVITAMIN W/MINERALS CH
1.0000 | ORAL_TABLET | Freq: Every day | ORAL | Status: DC
  Administered 2015-10-12 – 2015-10-15 (×4): 1 via ORAL
  Filled 2015-10-11 (×7): qty 1

## 2015-10-11 MED ORDER — VITAMIN B-1 100 MG PO TABS
100.0000 mg | ORAL_TABLET | Freq: Every day | ORAL | Status: DC
  Administered 2015-10-12 – 2015-10-15 (×4): 100 mg via ORAL
  Filled 2015-10-11 (×7): qty 1

## 2015-10-11 MED ORDER — MAGNESIUM HYDROXIDE 400 MG/5ML PO SUSP: 30.0000 mL | Freq: Every day | ORAL | Status: DC | PRN

## 2015-10-11 MED ORDER — GABAPENTIN 100 MG PO CAPS
200.0000 mg | ORAL_CAPSULE | Freq: Three times a day (TID) | ORAL | Status: DC
  Administered 2015-10-11 – 2015-10-12 (×3): 200 mg via ORAL
  Filled 2015-10-11 (×10): qty 2

## 2015-10-11 MED ORDER — IBUPROFEN 600 MG PO TABS
600.0000 mg | ORAL_TABLET | Freq: Four times a day (QID) | ORAL | Status: DC | PRN
  Administered 2015-10-11 – 2015-10-12 (×2): 600 mg via ORAL
  Filled 2015-10-11 (×2): qty 1

## 2015-10-11 MED ORDER — HYDROXYZINE HCL 50 MG PO TABS
50.0000 mg | ORAL_TABLET | Freq: Three times a day (TID) | ORAL | Status: AC
  Administered 2015-10-11 – 2015-10-12 (×3): 50 mg via ORAL
  Filled 2015-10-11 (×5): qty 1

## 2015-10-11 MED ORDER — ACETAMINOPHEN 325 MG PO TABS: 650.0000 mg | ORAL_TABLET | Freq: Four times a day (QID) | ORAL | Status: DC | PRN

## 2015-10-11 MED ORDER — ALUM & MAG HYDROXIDE-SIMETH 200-200-20 MG/5ML PO SUSP: 30.0000 mL | ORAL | Status: DC | PRN

## 2015-10-11 NOTE — Progress Notes (Signed)
Nursing Shift Assessment:  Patient has slept much of the shift thus far, intake has been approximately 50% though patient denies any nausea or digestive discomfort or issue. Patient states he is still having SI and thougths of "harming others" but denies AVH. Nurse asking patient why he is not eating more and patient states "i"m just doing a lot of thinking". Patient does contract for safety and verbally agrees that he will alert staff before acting upon any such thoughts. Staff providing continuous observation for safety except when patient in bathroom. Pateint remains safe on Unit, is compliant with medications ordered.

## 2015-10-11 NOTE — Progress Notes (Signed)
Patient slept most of the shift. Appear sleepy during the time of one-one. Could not respond to most of the questions.  Sleeping at this time. Will continue to monitor patient.

## 2015-10-11 NOTE — Progress Notes (Signed)
Psychoeducational Group Note  Date:  10/11/2015 Time: 2100 Group Topic/Focus:  wrap up group  Participation Level: Did Not Attend  Participation Quality:  Not Applicable  Affect:  Not Applicable  Cognitive:  Not Applicable  Insight:  Not Applicable  Engagement in Group: Not Applicable  Additional Comments:  Pt was a new admit and remained sleeping during group time.   Shelah LewandowskySquires, Akeisha Lagerquist Carol 10/11/2015, 10:10 PM

## 2015-10-11 NOTE — Tx Team (Signed)
Initial Interdisciplinary Treatment Plan   PATIENT STRESSORS: Financial difficulties Marital or family conflict   PATIENT STRENGTHS: Ability for insight Average or above average intelligence Communication skills General fund of knowledge   PROBLEM LIST: Problem List/Patient Goals Date to be addressed Date deferred Reason deferred Estimated date of resolution  "I don't want to feel this way." 10/11/2015     No income 10/11/2015     "I have anger issues." 10/11/2015     "I'm here for depression and stuff." 10/11/2015                                    DISCHARGE CRITERIA:  Ability to meet basic life and health needs Improved stabilization in mood, thinking, and/or behavior  PRELIMINARY DISCHARGE PLAN: Return to previous living arrangement  PATIENT/FAMIILY INVOLVEMENT: This treatment plan has been presented to and reviewed with the patient, [name],   [name] 10/11/2015, 4:08 PM

## 2015-10-11 NOTE — Progress Notes (Signed)
Nursing Note:  Report called to Morrie Sheldonshley on Adult Inpatient Unit; patient is to be transferred to room 302.
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
## 2015-10-12 DIAGNOSIS — F332 Major depressive disorder, recurrent severe without psychotic features: Secondary | ICD-10-CM

## 2015-10-12 DIAGNOSIS — F6089 Other specific personality disorders: Secondary | ICD-10-CM

## 2015-10-12 MED ORDER — GABAPENTIN 300 MG PO CAPS
300.0000 mg | ORAL_CAPSULE | Freq: Three times a day (TID) | ORAL | Status: DC
  Administered 2015-10-12 – 2015-10-15 (×12): 300 mg via ORAL
  Filled 2015-10-12 (×25): qty 1

## 2015-10-12 MED ORDER — IBUPROFEN 800 MG PO TABS
800.0000 mg | ORAL_TABLET | Freq: Four times a day (QID) | ORAL | Status: DC | PRN
  Administered 2015-10-12 – 2015-10-15 (×8): 800 mg via ORAL
  Filled 2015-10-12 (×8): qty 1

## 2015-10-12 MED ORDER — LAMOTRIGINE 25 MG PO TABS
25.0000 mg | ORAL_TABLET | Freq: Every day | ORAL | Status: DC
  Administered 2015-10-12 – 2015-10-14 (×3): 25 mg via ORAL
  Filled 2015-10-12 (×6): qty 1

## 2015-10-12 MED ORDER — METAXALONE 400 MG HALF TABLET
400.0000 mg | ORAL_TABLET | Freq: Four times a day (QID) | ORAL | Status: DC
  Administered 2015-10-12 – 2015-10-15 (×12): 400 mg via ORAL
  Filled 2015-10-12 (×21): qty 1

## 2015-10-12 MED ORDER — RISPERIDONE 1 MG PO TABS
1.0000 mg | ORAL_TABLET | Freq: Every day | ORAL | Status: DC
  Administered 2015-10-12 – 2015-10-14 (×3): 1 mg via ORAL
  Filled 2015-10-12 (×6): qty 1
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
## 2015-10-12 NOTE — [facility] Group Notes (Signed)
[facility] Group Notes:  (Clinical Social Work)  10/12/2015  10:00-11:00AM  Summary of Progress/Problems:   The main focus of today's process group was to   1)  discuss the importance of adding supports  2)  define health supports versus unhealthy supports  3)  identify the patient's current unhealthy supports and plan how to handle them  4)  Identify the patient's current healthy supports and plan what to add.  An emphasis was placed on using counselor, doctor, therapy groups, 12-step groups, and problem-specific support groups to expand supports.    The patient expressed full comprehension of the concepts presented, and agreed that there is a need to add more supports.  The patient stated that sometimes he can be a healthy support for himself, but not currently.  He was called out to see doctor, did not return.  Type of Therapy:  Process Group with Motivational Interviewing  Participation Level:  Minimal  Participation Quality:  Attentive  Affect:  Flat  Cognitive:  Alert  Insight:  Improving  Engagement in Therapy:  Limited  Modes of Intervention:   Education, Support and Processing, Activity  Ambrose MantleMareida Grossman-Orr, LCSW 10/12/2015

## 2015-10-12 NOTE — Progress Notes (Signed)
Patient attended evening group on the [address].

## 2015-10-12 NOTE — Progress Notes (Signed)
D. Pt had been in room and in bed for much of the evening, did not attend evening group activity. Pt has appeared flat and depressed this evening and has endorsed depression this evening. Pt had minimal interaction with staff and peers this evening and did not request or receive any medications. A. Support and encouragement provided. R. Safety maintained, will continue to monitor.

## 2015-10-12 NOTE — Progress Notes (Signed)
D: Patient presents depressed affect, sad. Pt interacts little with this nurse , isolating. Pt attended nursing group with no interaction. Denies SI/HI. Complains of right shoulder pain.   A:Special checks q 15 mins in place for safety. Medication administered per MD order (see eMAR) Encouragement and support provided.  R:Safety maintained. Compliant with medication regimen. Will continue to monitor.
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
## 2015-10-12 NOTE — [facility] Group Notes (Signed)
[facility] Group Notes:  (Nursing/MHT/Case Management/Adjunct)  Date:  10/12/2015  Time:0930  Type of Therapy:  Nurse Education  Participation Level:  Minimal  Participation Quality:  Resistant  Affect:  Flat  Cognitive:  Lacking  Insight:  Lacking  Engagement in Group:  None  Modes of Intervention:  Activity, Clarification, Socialization and Support  Summary of Progress/Problems:  [name] N [name] 10/12/2015, 10:17 AM

## 2015-10-13 ENCOUNTER — Encounter (HOSPITAL_COMMUNITY): Payer: Self-pay | Admitting: Psychiatry

## 2015-10-13 DIAGNOSIS — F332 Major depressive disorder, recurrent severe without psychotic features: Principal | ICD-10-CM

## 2015-10-13 LAB — COMPREHENSIVE METABOLIC PANEL
ALT: 81 U/L — ABNORMAL HIGH (ref 17–63)
ANION GAP: 9 (ref 5–15)
AST: 51 U/L — ABNORMAL HIGH (ref 15–41)
Albumin: 4 g/dL (ref 3.5–5.0)
Alkaline Phosphatase: 49 U/L (ref 38–126)
BILIRUBIN TOTAL: 0.6 mg/dL (ref 0.3–1.2)
BUN: 14 mg/dL (ref 6–20)
CO2: 26 mmol/L (ref 22–32)
Calcium: 8.9 mg/dL (ref 8.9–10.3)
Chloride: 107 mmol/L (ref 101–111)
Creatinine, Ser: 1.2 mg/dL (ref 0.61–1.24)
Glucose, Bld: 127 mg/dL — ABNORMAL HIGH (ref 65–99)
POTASSIUM: 4 mmol/L (ref 3.5–5.1)
Sodium: 142 mmol/L (ref 135–145)
TOTAL PROTEIN: 7.5 g/dL (ref 6.5–8.1)

## 2015-10-13 LAB — CBC
HEMATOCRIT: 44.6 % (ref 39.0–52.0)
Hemoglobin: 14.8 g/dL (ref 13.0–17.0)
MCH: 28.2 pg (ref 26.0–34.0)
MCHC: 33.2 g/dL (ref 30.0–36.0)
MCV: 85.1 fL (ref 78.0–100.0)
Platelets: 303 10*3/uL (ref 150–400)
RBC: 5.24 MIL/uL (ref 4.22–5.81)
RDW: 13.4 % (ref 11.5–15.5)
WBC: 9.2 10*3/uL (ref 4.0–10.5)

## 2015-10-13 NOTE — B[facility] Counselor (Signed)
CSW made two attempts to meet with pt in order to complete Psychosocial Assessment and discuss treatment plan/aftercare. Pt in room sleeping. Pt not willing to meet with CSW today.  ,Trula SladeHeather Smart, MSW, LCSW Clinical Social Worker 10/13/2015 3:28 PM
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
## 2015-10-13 NOTE — [facility] Group Notes (Signed)
[facility] LCSW Group Therapy  10/13/2015 11:34 AM  Type of Therapy:  Group Therapy  Participation Level:  Did Not Attend-pt invited. Chose to remain in bed.  Modes of Intervention:  Confrontation, Discussion, Education, Exploration, Problem-solving, Rapport Building, Socialization and Support  Summary of Progress/Problems: Today's Topic: Overcoming Obstacles. Patients identified one short term goal and potential obstacles in reaching this goal. Patients processed barriers involved in overcoming these obstacles. Patients identified steps necessary for overcoming these obstacles and explored motivation (internal and external) for facing these difficulties head on.   Smart, Dwan Fennel LCSW 10/13/2015, 11:34 AM  

## 2015-10-13 NOTE — [facility] Group Notes (Signed)
[facility]   10/13/2015 11:00 AM  Participation Quality:  Invited. DID NOT ATTEND. Pt chose to remain in bed.   Smart, Tessa Seaberry LCSW

## 2015-10-13 NOTE — Progress Notes (Signed)
Adult Psychoeducational Group Note  Date:  10/13/2015 Time:  9:56 PM  Group Topic/Focus:  Wrap-Up Group:   The focus of this group is to help patients review their daily goal of treatment and discuss progress on daily workbooks.  Participation Level:  Active  Participation Quality:  Appropriate  Affect:  Appropriate  Cognitive:  Appropriate  Insight: Good  Engagement in Group:  Engaged  Modes of Intervention:  Activity  Additional Comments:  Patient rated his day an 8. Goal is to become a better father for children.  Natasha MeadKiara M Alianis Trimmer 10/13/2015, 9:56 PM

## 2015-10-13 NOTE — Progress Notes (Signed)
D: Patient in the dayroom on approach.  Patient states  he wants to be a better father to his children.  Patient appears to have a brighter affect tonight but still states he is depressed. Patient states his goal is to work on himself while he is here.  Patient denies SI/HI and denies AVH. A: Staff to monitor Q 15 mins for safety.  Encouragement and support offered.  Scheduled medications administered per orders.  Ibuprofen administered prn for right shoulder pain.   R: Patient remains safe on the unit.  Patient attended group tonight.  Patient visible on the unit and interacting with peers.  Patient taking administered medications.

## 2015-10-13 NOTE — Plan of Care (Signed)
Problem: Diagnosis: Increased Risk For Suicide Attempt Goal: STG-Patient Will Comply With Medication Regime Outcome: Progressing Compliant with medication regime     
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
## 2015-10-13 NOTE — Progress Notes (Signed)
Patient ID: [name], male   DOB: 1971-03-27, 44 y.o.   MRN: [id number]  DAR: Pt. Denies SI/HI and A/V Hallucinations. He reports sleep is fair, appetite is fair, energy level is normal, and concentration is good. Patient rates depression, hopelessness, and anxiety is 8/10. Patient does continue to report pain in shoulder and is receiving scheduled and PRN medications for this with minimal relief. However patient reports that he knows why it is hurting and is tolerating pain as well as he can for the given circumstance. Support and encouragement provided to the patient. Scheduled medications administered to patient per physician's orders. Patient is receptive and cooperative. He is seen in the milieu and interacts with staff when staff initiates conversation. Q15 minute checks are maintained for safety.

## 2015-10-13 NOTE — Plan of Care (Signed)
Problem: Alteration in mood Goal: LTG-Patient reports reduction in suicidal thoughts (Patient reports reduction in suicidal thoughts and is able to verbalize a safety plan for whenever patient is feeling suicidal)  Outcome: Progressing Patient denies SI.  Patient verbally contracts for safety.     

## 2015-10-14 MED ORDER — LAMOTRIGINE 25 MG PO TABS
25.0000 mg | ORAL_TABLET | Freq: Two times a day (BID) | ORAL | Status: DC
  Administered 2015-10-15: 25 mg via ORAL
  Filled 2015-10-14 (×5): qty 1

## 2015-10-14 MED ORDER — GABAPENTIN 300 MG PO CAPS: 300.0000 mg | ORAL_CAPSULE | Freq: Every day | ORAL | Status: DC

## 2015-10-14 NOTE — Progress Notes (Signed)
D: Patient in the dayroom on approach.  Patient states he had a good day today except he continues to have pain in his right shoulder. Patient states he plans to d/c Thursday.  Patient states he wants to be a better father and get back to work.  Patient states his goal for today is to be less depressed.  Patient states he met his goal for today. Patient denies SI/HI and denies AVH.   A: Staff to monitor Q 15 mins for safety.  Encouragement and support offered.  Scheduled medications administered per orders.  Ibuprofen administered prn for right shoulder pain. R: Patient remains safe on the unit.  Patient attended group tonight. Patient visible on the unit and interacting with peers.  Patient taking administered medications.

## 2015-10-14 NOTE — [facility] Group Notes (Signed)
[facility] LCSW Group Therapy  10/14/2015 1:00 PM  Type of Therapy:  Group Therapy  Participation Level:  Minimal  Participation Quality:  Drowsy  Affect:  Lethargic  Cognitive:  Lacking  Insight:  Limited  Engagement in Therapy:  Limited  Modes of Intervention:  Confrontation, Discussion, Education, Exploration, Problem-solving, Rapport Building, Socialization and Support  Summary of Progress/Problems: [facility] Speaker came to talk about his personal journey with substance abuse and addiction. The pt processed ways by which to relate to the speaker. MHA speaker provided handouts and educational information pertaining to groups and services [facility].   Smart, Jakwon Gayton LCSW 10/14/2015, 1:00 PM

## 2015-10-14 NOTE — Progress Notes (Signed)
Attended group 

## 2015-10-14 NOTE — [facility] Group Notes (Signed)
[facility] Group Notes:  (Nursing/MHT/Case Management/Adjunct)  Date:  10/14/2015  Time:  0900  Type of Therapy:  Nurse Education  Participation Level:  Did Not Attend  Participation Quality:    Affect:    Cognitive:    Insight:    Engagement in Group:    Modes of Intervention:    Summary of Progress/Problems: Patient was invited to group however elected not to attend.  Merian CapronFriedman, Leyana Whidden [facility] Hospital[facility] 10/14/2015, 0930

## 2015-10-14 NOTE — Plan of Care (Signed)
Problem: Ineffective individual coping Goal: STG: Patient will remain free from self harm Outcome: Progressing Patient has not engaged in self harm, denies SI.  Problem: Aggression Towards others,Towards Self, and or Destruction Goal: STG-Patient will comply with prescribed medication regimen (Patient will comply with prescribed medication regimen)  Outcome: Progressing Patient has been med compliant.
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
## 2015-10-14 NOTE — Progress Notes (Signed)
Patient up and visible in milieu. Affect animated, mood anxious. Rates his depression, hopelessness and anxiety all at an 8/10. States his goal for today is to "work on my depression." Continues with chronic R shoulder pain of a 7/10. Receiving motrin per prn order. Patient offered emotional support, medicated per orders. Remains cooperative. Denies SI/HI and patient remains safe on level III obs. [name], Syleena Mchan Eakes

## 2015-10-14 NOTE — Progress Notes (Signed)
Recreation Therapy Notes  Animal-Assisted Activity (AAA) Program Checklist/Progress Notes Patient Eligibility Criteria Checklist & Daily Group note for Rec Tx Intervention  Date: 12.13.2016  Time: 2:45pm [address]    AAA/T Program Assumption of Risk Form signed by Patient/ or Parent Legal Guardian yes  Patient is free of allergies or sever asthma yes  Patient reports no fear of animals yes  Patient reports no history of cruelty to animals yes  Patient understands his/her participation is voluntary yes  Behavioral Response: Did not attend.   Marykay Lexenise L Shamera Yarberry, LRT/CTRS        Elad Macphail L 10/14/2015 3:17 PM

## 2015-10-15 DIAGNOSIS — F333 Major depressive disorder, recurrent, severe with psychotic symptoms: Secondary | ICD-10-CM | POA: Insufficient documentation

## 2015-10-15 MED ORDER — METAXALONE 400 MG PO TABS: 400.0000 mg | ORAL_TABLET | Freq: Four times a day (QID) | ORAL | Status: DC

## 2015-10-15 MED ORDER — LAMOTRIGINE 25 MG PO TABS: 25.0000 mg | ORAL_TABLET | Freq: Two times a day (BID) | ORAL | Status: DC

## 2015-10-15 MED ORDER — GABAPENTIN 300 MG PO CAPS: 300.0000 mg | ORAL_CAPSULE | Freq: Three times a day (TID) | ORAL | Status: DC

## 2015-10-15 MED ORDER — TRAZODONE HCL 50 MG PO TABS: 50.0000 mg | ORAL_TABLET | Freq: Every evening | ORAL | Status: DC | PRN

## 2015-10-15 MED ORDER — RISPERIDONE 1 MG PO TABS: 1.0000 mg | ORAL_TABLET | Freq: Every day | ORAL | Status: DC

## 2015-10-15 MED ORDER — IBUPROFEN 800 MG PO TABS: 800.0000 mg | ORAL_TABLET | Freq: Four times a day (QID) | ORAL | Status: DC | PRN

## 2015-10-15 NOTE — [facility] Suicide Risk Assessment (Signed)
[facility] INPATIENT:  Family/Significant Other Suicide Prevention Education  Suicide Prevention Education:  Contact Attempts: [name] (pt's mother) [phone] has been identified by the patient as the family member/significant other with whom the patient will be residing, and identified as the person(s) who will aid the patient in the event of a mental health crisis.  With written consent from the patient, two attempts were made to provide suicide prevention education, prior to and/or following the patient's discharge.  We were unsuccessful in providing suicide prevention education.  A suicide education pamphlet was given to the patient to share with family/significant other.  Date and time of first attempt: 10/14/15 at 4:00PM (line busy -called twice) Date and time of second attempt:10/15/15 at 12:07PM (no answer/unable to leave voicemail).   Smart, Timothy Barstow LCSW 10/15/2015, 12:09 PM

## 2015-10-15 NOTE — Progress Notes (Signed)
Recreation Therapy Notes  Date: 12.14.2016  Time: 9:30am Location: 300 Hall Group Room   Group Topic: Stress Management  Goal Area(s) Addresses:  Patient will actively participate in stress management techniques presented during session.   Behavioral Response: Appropriate   Intervention: Stress management techniques  Activity :  Deep Breathing and Guided Imagery. LRT provided instruction and demonstration on practice of Guided Imagery. Technique was coupled with deep breathing.   Education:  Stress Management, Discharge Planning.   Education Outcome: Acknowledges education  Clinical Observations/Feedback: Patient actively engaged in technique introduced, expressed no concerns and demonstrated ability to practice independently post d/c.   Marykay Lexenise L Yasir Kitner, LRT/CTRS        Lando Alcalde L 10/15/2015 2:12 PM

## 2015-10-15 NOTE — Progress Notes (Signed)
Patient verbalizes readiness for discharge. Follow up plan explained, Rx's given and all belongings returned. Patient verbalizes understanding. Denies SI/HI and assures this Clinical research associatewriter he will seek assistance should that status change. Patient discharged ambulatory and in stable condition [facility]. Lawrence MarseillesFriedman, Allure Greaser Eakes

## 2015-10-15 NOTE — Progress Notes (Signed)
Patient up and visible in milieu. Pleasant, affect appropriate though he rates his depression, hopelessness and anxiety all at a 7/10. Continues to have chronic shoulder pain of a 9/10 though was medicated prior to shift change. Reports his goal is to continue working on his depression. Patient medicated per orders. Offered emotional support. Discussed with CM, MD. Denies SI/HI. Awaiting discharge order, materials. Remains safe on level III obs. Lawrence MarseillesFriedman, Korine Winton Eakes

## 2015-10-15 NOTE — Progress Notes (Signed)
  [facility] :  Will you be returning to the same living situation after discharge:  Yes,  home At discharge, do you have transportation home?: Yes,  car in parking lot Do you have the ability to pay for your medications: Yes,  [facility]  Release of information consent forms completed and submitted to medical records by CSW.  Patient to Follow up at: Follow-up Information    [facility] On 10[facility]21/[facility].   Why:  Appt on this date at 1:00PM with Tracie for counseling. Please bring Medicaid card to this appt. Please call within 24 hours of appt to cancel or reschedule if necessary. Thank you.     Contact information:   [facility] [address]  [place], [place] [address] Phone: [phone] Fax: [phone]      [facility]   Why:  Walk in between 8am-9am Monday through Friday for hospital follow-up/medication management/assessment for mental health services.    Contact information:   [address] [place], [place] [address] Phone: [phone] Fax: [phone]      Next level of care provider has [facility] Link:no  Patient denies SI/HI: Yes,  during group/self report.     Safety Planning and Suicide Prevention discussed: Yes,  SPE completed with pt. Contact attempts made with pt's mother. SPI pamphlet and mobile crisis information provided to pt.   Have you used any form of tobacco in the last 30 days? (Cigarettes, Smokeless Tobacco, Cigars, and/or Pipes): No  Has patient been referred to the Quitline?: N/A patient is not a smoker  Patient has been referred for addiction treatment: Pt. refused referral  Smart, Della Homan LCSW 10/15/2015, 12:10 PM
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
## 2016-03-08 ENCOUNTER — Emergency Department (HOSPITAL_COMMUNITY): Payer: No Typology Code available for payment source

## 2016-03-08 ENCOUNTER — Emergency Department (HOSPITAL_COMMUNITY)
Admission: EM | Admit: 2016-03-08 | Discharge: 2016-03-08 | Disposition: A | Discharge status: Home / Self Care | Payer: No Typology Code available for payment source | Attending: Emergency Medicine | Admitting: Emergency Medicine

## 2016-03-08 ENCOUNTER — Encounter (HOSPITAL_COMMUNITY): Payer: Self-pay | Admitting: Emergency Medicine

## 2016-03-08 DIAGNOSIS — Y92410 Unspecified street and highway as the place of occurrence of the external cause: Secondary | ICD-10-CM | POA: Diagnosis not present

## 2016-03-08 DIAGNOSIS — Y998 Other external cause status: Secondary | ICD-10-CM | POA: Diagnosis not present

## 2016-03-08 DIAGNOSIS — Z79899 Other long term (current) drug therapy: Secondary | ICD-10-CM | POA: Insufficient documentation

## 2016-03-08 DIAGNOSIS — S46911A Strain of unspecified muscle, fascia and tendon at shoulder and upper arm level, right arm, initial encounter: Secondary | ICD-10-CM | POA: Diagnosis not present

## 2016-03-08 DIAGNOSIS — Y9389 Activity, other specified: Secondary | ICD-10-CM | POA: Diagnosis not present

## 2016-03-08 DIAGNOSIS — S4991XA Unspecified injury of right shoulder and upper arm, initial encounter: Secondary | ICD-10-CM | POA: Diagnosis present

## 2016-03-08 MED ORDER — TRAMADOL HCL 50 MG PO TABS: 50.0000 mg | ORAL_TABLET | Freq: Four times a day (QID) | ORAL | Status: DC | PRN

## 2016-03-08 NOTE — Discharge Instructions (Signed)
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
## 2016-03-08 NOTE — ED Notes (Signed)
Taken to xray at this time. 
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
## 2017-07-12 ENCOUNTER — Ambulatory Visit (INDEPENDENT_AMBULATORY_CARE_PROVIDER_SITE_OTHER): Payer: Self-pay | Admitting: Orthopaedic Surgery

## 2017-07-14 ENCOUNTER — Ambulatory Visit (INDEPENDENT_AMBULATORY_CARE_PROVIDER_SITE_OTHER): Payer: Self-pay | Admitting: Orthopaedic Surgery

## 2018-07-09 ENCOUNTER — Encounter (HOSPITAL_COMMUNITY): Payer: Self-pay | Admitting: Behavioral Health

## 2018-07-09 ENCOUNTER — Ambulatory Visit (HOSPITAL_COMMUNITY)
Admission: RE | Admit: 2018-07-09 | Discharge: 2018-07-09 | Disposition: A | Discharge status: Home / Self Care | Payer: Medicaid Other | Attending: Psychiatry | Admitting: Psychiatry

## 2018-07-09 DIAGNOSIS — F332 Major depressive disorder, recurrent severe without psychotic features: Secondary | ICD-10-CM | POA: Diagnosis present
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
## 2018-07-09 NOTE — H&P (Signed)
Behavioral Health Medical Screening Exam  [name] is an 47 y.o. male.  Total Time spent with patient: 20 minutes  Psychiatric Specialty Exam: Physical Exam  Nursing note and vitals reviewed. Constitutional: He is oriented to person, place, and time. He appears well-developed and well-nourished.  Cardiovascular: Normal rate.  Respiratory: Effort normal.  Musculoskeletal: Normal range of motion.  Neurological: He is alert and oriented to person, place, and time.  Skin: Skin is warm.    Review of Systems  Constitutional: Negative.   HENT: Negative.   Eyes: Negative.   Respiratory: Negative.   Cardiovascular: Negative.   Gastrointestinal: Negative.   Genitourinary: Negative.   Musculoskeletal: Negative.   Skin: Negative.   Neurological: Negative.   Endo/Heme/Allergies: Negative.   Psychiatric/Behavioral: Positive for depression. Negative for hallucinations, substance abuse and suicidal ideas. The patient is nervous/anxious. The patient does not have insomnia.     Blood pressure (!) 158/98, pulse 93, temperature 98.4 F (36.9 C), SpO2 98 %.There is no height or weight on file to calculate BMI.  General Appearance: Casual  Eye Contact:  Good  Speech:  Clear and Coherent and Normal Rate  Volume:  Normal  Mood:  Depressed  Affect:  Congruent  Thought Process:  Goal Directed and Descriptions of Associations: Intact  Orientation:  Full (Time, Place, and Person)  Thought Content:  WDL  Suicidal Thoughts:  No  Homicidal Thoughts:  No  Memory:  Immediate;   Good Recent;   Good Remote;   Good  Judgement:  Fair  Insight:  Fair  Psychomotor Activity:  Normal  Concentration: Concentration: Good and Attention Span: Good  Recall:  Good  Fund of Knowledge:Good  Language: Good  Akathisia:  No  Handed:  Right  AIMS (if indicated):     Assets:  Communication Skills Desire for Improvement Housing Physical Health Social Support Transportation  Sleep:        Musculoskeletal: Strength & Muscle Tone: within normal limits Gait & Station: normal Patient leans: N/A  Blood pressure (!) 158/98, pulse 93, temperature 98.4 F (36.9 C), SpO2 98 %.  Recommendations:  Based on my evaluation the patient does not appear to have an emergency medical condition.  Maryfrances Bunnell, FNP 07/09/2018, 6:35 PM

## 2018-09-25 ENCOUNTER — Encounter (HOSPITAL_COMMUNITY): Payer: Self-pay | Admitting: Emergency Medicine

## 2018-09-25 ENCOUNTER — Other Ambulatory Visit: Payer: Self-pay

## 2018-09-25 ENCOUNTER — Observation Stay (HOSPITAL_COMMUNITY)
Admission: EM | Admit: 2018-09-25 | Discharge: 2018-09-26 | Disposition: A | Discharge status: Home / Self Care | Payer: Medicaid Other | Attending: Internal Medicine | Admitting: Internal Medicine

## 2018-09-25 ENCOUNTER — Observation Stay (HOSPITAL_COMMUNITY): Payer: Medicaid Other

## 2018-09-25 ENCOUNTER — Observation Stay (HOSPITAL_BASED_OUTPATIENT_CLINIC_OR_DEPARTMENT_OTHER): Payer: Medicaid Other

## 2018-09-25 ENCOUNTER — Emergency Department (HOSPITAL_COMMUNITY): Payer: Medicaid Other

## 2018-09-25 DIAGNOSIS — Z23 Encounter for immunization: Secondary | ICD-10-CM | POA: Insufficient documentation

## 2018-09-25 DIAGNOSIS — Z79899 Other long term (current) drug therapy: Secondary | ICD-10-CM | POA: Insufficient documentation

## 2018-09-25 DIAGNOSIS — R202 Paresthesia of skin: Secondary | ICD-10-CM | POA: Diagnosis present

## 2018-09-25 DIAGNOSIS — R51 Headache: Secondary | ICD-10-CM | POA: Insufficient documentation

## 2018-09-25 DIAGNOSIS — R200 Anesthesia of skin: Secondary | ICD-10-CM

## 2018-09-25 DIAGNOSIS — H9121 Sudden idiopathic hearing loss, right ear: Secondary | ICD-10-CM

## 2018-09-25 DIAGNOSIS — Z7982 Long term (current) use of aspirin: Secondary | ICD-10-CM | POA: Diagnosis not present

## 2018-09-25 DIAGNOSIS — J3489 Other specified disorders of nose and nasal sinuses: Secondary | ICD-10-CM | POA: Diagnosis not present

## 2018-09-25 DIAGNOSIS — H9311 Tinnitus, right ear: Secondary | ICD-10-CM | POA: Insufficient documentation

## 2018-09-25 DIAGNOSIS — F419 Anxiety disorder, unspecified: Secondary | ICD-10-CM | POA: Diagnosis not present

## 2018-09-25 DIAGNOSIS — R531 Weakness: Secondary | ICD-10-CM | POA: Diagnosis not present

## 2018-09-25 DIAGNOSIS — R42 Dizziness and giddiness: Secondary | ICD-10-CM | POA: Diagnosis not present

## 2018-09-25 DIAGNOSIS — Z1810 Retained metal fragments, unspecified: Secondary | ICD-10-CM | POA: Diagnosis not present

## 2018-09-25 DIAGNOSIS — R21 Rash and other nonspecific skin eruption: Secondary | ICD-10-CM | POA: Diagnosis not present

## 2018-09-25 DIAGNOSIS — M25511 Pain in right shoulder: Secondary | ICD-10-CM | POA: Diagnosis not present

## 2018-09-25 DIAGNOSIS — Z791 Long term (current) use of non-steroidal anti-inflammatories (NSAID): Secondary | ICD-10-CM | POA: Insufficient documentation

## 2018-09-25 DIAGNOSIS — H9209 Otalgia, unspecified ear: Secondary | ICD-10-CM | POA: Insufficient documentation

## 2018-09-25 DIAGNOSIS — F4310 Post-traumatic stress disorder, unspecified: Secondary | ICD-10-CM | POA: Diagnosis not present

## 2018-09-25 DIAGNOSIS — F333 Major depressive disorder, recurrent, severe with psychotic symptoms: Secondary | ICD-10-CM | POA: Insufficient documentation

## 2018-09-25 DIAGNOSIS — R29818 Other symptoms and signs involving the nervous system: Secondary | ICD-10-CM | POA: Diagnosis not present

## 2018-09-25 DIAGNOSIS — G8929 Other chronic pain: Secondary | ICD-10-CM | POA: Insufficient documentation

## 2018-09-25 DIAGNOSIS — F918 Other conduct disorders: Secondary | ICD-10-CM | POA: Diagnosis not present

## 2018-09-25 HISTORY — DX: Other symptoms and signs involving the nervous system: R29.818

## 2018-09-25 LAB — URINALYSIS, ROUTINE W REFLEX MICROSCOPIC
BILIRUBIN URINE: NEGATIVE
Bacteria, UA: NONE SEEN
Glucose, UA: NEGATIVE mg/dL
Ketones, ur: NEGATIVE mg/dL
LEUKOCYTES UA: NEGATIVE
NITRITE: NEGATIVE
PH: 5 (ref 5.0–8.0)
Protein, ur: NEGATIVE mg/dL
SPECIFIC GRAVITY, URINE: 1.021 (ref 1.005–1.030)

## 2018-09-25 LAB — I-STAT CHEM 8, ED
BUN: 7 mg/dL (ref 6–20)
Calcium, Ion: 1.13 mmol/L — ABNORMAL LOW (ref 1.15–1.40)
Chloride: 103 mmol/L (ref 98–111)
Creatinine, Ser: 1 mg/dL (ref 0.61–1.24)
Glucose, Bld: 106 mg/dL — ABNORMAL HIGH (ref 70–99)
HEMATOCRIT: 49 % (ref 39.0–52.0)
Hemoglobin: 16.7 g/dL (ref 13.0–17.0)
POTASSIUM: 3.7 mmol/L (ref 3.5–5.1)
SODIUM: 140 mmol/L (ref 135–145)
TCO2: 28 mmol/L (ref 22–32)

## 2018-09-25 LAB — BASIC METABOLIC PANEL
ANION GAP: 9 (ref 5–15)
BUN: 9 mg/dL (ref 6–20)
CALCIUM: 9 mg/dL (ref 8.9–10.3)
CO2: 25 mmol/L (ref 22–32)
Chloride: 105 mmol/L (ref 98–111)
Creatinine, Ser: 0.93 mg/dL (ref 0.61–1.24)
Glucose, Bld: 108 mg/dL — ABNORMAL HIGH (ref 70–99)
Potassium: 3.5 mmol/L (ref 3.5–5.1)
SODIUM: 139 mmol/L (ref 135–145)

## 2018-09-25 LAB — CBC WITH DIFFERENTIAL/PLATELET
Abs Immature Granulocytes: 0.06 10*3/uL (ref 0.00–0.07)
BASOS ABS: 0 10*3/uL (ref 0.0–0.1)
BASOS PCT: 0 %
EOS ABS: 0.2 10*3/uL (ref 0.0–0.5)
Eosinophils Relative: 1 %
HCT: 48.9 % (ref 39.0–52.0)
Hemoglobin: 15.6 g/dL (ref 13.0–17.0)
IMMATURE GRANULOCYTES: 1 %
LYMPHS PCT: 28 %
Lymphs Abs: 3.2 10*3/uL (ref 0.7–4.0)
MCH: 27.6 pg (ref 26.0–34.0)
MCHC: 31.9 g/dL (ref 30.0–36.0)
MCV: 86.5 fL (ref 80.0–100.0)
MONOS PCT: 8 %
Monocytes Absolute: 1 10*3/uL (ref 0.1–1.0)
NEUTROS ABS: 7.3 10*3/uL (ref 1.7–7.7)
Neutrophils Relative %: 62 %
PLATELETS: 306 10*3/uL (ref 150–400)
RBC: 5.65 MIL/uL (ref 4.22–5.81)
RDW: 14.3 % (ref 11.5–15.5)
WBC: 11.7 10*3/uL — AB (ref 4.0–10.5)
nRBC: 0 % (ref 0.0–0.2)

## 2018-09-25 LAB — LIPID PANEL
Cholesterol: 135 mg/dL (ref 0–200)
HDL: 38 mg/dL — ABNORMAL LOW (ref >40)
LDL CALC: 88 mg/dL (ref 0–99)
Total CHOL/HDL Ratio: 3.6 RATIO
Triglycerides: 44 mg/dL (ref <150)
VLDL: 9 mg/dL (ref 0–40)

## 2018-09-25 LAB — ECHOCARDIOGRAM COMPLETE BUBBLE STUDY
Height: 68 in
Weight: 3360 oz

## 2018-09-25 LAB — RAPID URINE DRUG SCREEN, HOSP PERFORMED
AMPHETAMINES: NOT DETECTED
Barbiturates: NOT DETECTED
Benzodiazepines: NOT DETECTED
Cocaine: NOT DETECTED
Opiates: NOT DETECTED
Tetrahydrocannabinol: NOT DETECTED

## 2018-09-25 LAB — HEMOGLOBIN A1C
HEMOGLOBIN A1C: 5.7 % — AB (ref 4.8–5.6)
Mean Plasma Glucose: 116.89 mg/dL

## 2018-09-25 LAB — I-STAT TROPONIN, ED: Troponin i, poc: 0 ng/mL (ref 0.00–0.08)

## 2018-09-25 LAB — PROTIME-INR
INR: 0.92
Prothrombin Time: 12.3 seconds (ref 11.4–15.2)

## 2018-09-25 LAB — ETHANOL: Alcohol, Ethyl (B): <10 mg/dL (ref <10)

## 2018-09-25 LAB — APTT: APTT: 32 s (ref 24–36)

## 2018-09-25 MED ORDER — ENOXAPARIN SODIUM 40 MG/0.4ML ~~LOC~~ SOLN
40.0000 mg | SUBCUTANEOUS | Status: DC
  Administered 2018-09-25 – 2018-09-26 (×2): 40 mg via SUBCUTANEOUS
  Filled 2018-09-25 (×2): qty 0.4

## 2018-09-25 MED ORDER — INFLUENZA VAC SPLIT QUAD 0.5 ML IM SUSY
0.5000 mL | PREFILLED_SYRINGE | INTRAMUSCULAR | Status: AC
  Administered 2018-09-26: 0.5 mL via INTRAMUSCULAR
  Filled 2018-09-25: qty 0.5

## 2018-09-25 MED ORDER — ACETAMINOPHEN 500 MG PO TABS
1000.0000 mg | ORAL_TABLET | Freq: Once | ORAL | Status: AC
  Administered 2018-09-25: 1000 mg via ORAL
  Filled 2018-09-25: qty 2

## 2018-09-25 MED ORDER — ASPIRIN EC 81 MG PO TBEC
81.0000 mg | DELAYED_RELEASE_TABLET | Freq: Every day | ORAL | Status: DC
  Administered 2018-09-25 – 2018-09-26 (×2): 81 mg via ORAL
  Filled 2018-09-25 (×2): qty 1

## 2018-09-25 MED ORDER — ATORVASTATIN CALCIUM 40 MG PO TABS
40.0000 mg | ORAL_TABLET | Freq: Every day | ORAL | Status: DC
  Administered 2018-09-25: 40 mg via ORAL
  Filled 2018-09-25: qty 1

## 2018-09-25 MED ORDER — POLYETHYLENE GLYCOL 3350 17 G PO PACK
17.0000 g | PACK | Freq: Every day | ORAL | Status: DC
  Filled 2018-09-25: qty 1

## 2018-09-25 MED ORDER — IOPAMIDOL (ISOVUE-370) INJECTION 76%
INTRAVENOUS | Status: AC
  Administered 2018-09-25: 75 mL
  Filled 2018-09-25: qty 100

## 2018-09-25 MED ORDER — OXYCODONE HCL 5 MG PO TABS
10.0000 mg | ORAL_TABLET | ORAL | Status: DC | PRN
  Administered 2018-09-25 – 2018-09-26 (×6): 10 mg via ORAL
  Filled 2018-09-25 (×6): qty 2

## 2018-09-25 NOTE — ED Notes (Signed)
[facility]
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
## 2018-09-25 NOTE — Progress Notes (Signed)
Pt transported to MRI 
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
## 2018-09-25 NOTE — Progress Notes (Signed)
SLP Cancellation Note  Patient Details Name: [name] MRN: [id number] DOB: 03-Feb-1971   Cancelled treatment:        Pt passed [facility] stroke swallow screen.  Please reorder if concerns for dysphagia are present.  Thanks. Donavan Burnetamara Sinia Antosh, MS [facility] SLP Acute Rehab Services Pager [phone] Office [phone]    Chales AbrahamsKimball, [name] 09/25/2018, 8:45 AM
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
## 2018-09-25 NOTE — ED Notes (Addendum)
Opyd said day shift hospitalist will need to see pt here at [facility] before [facility].

## 2018-09-25 NOTE — Progress Notes (Signed)
  Echocardiogram 2D Echocardiogram has been performed.  Timothy SavoyCasey N Cayden Fry 09/25/2018, 2:28 PM

## 2018-09-25 NOTE — Progress Notes (Signed)
Carotid artery duplex has been completed. 1-39% ICA stenosis bilaterally.  09/25/18 1:49 PM Olen CordialGreg Kemba Hoppes RVT

## 2018-09-25 NOTE — ED Triage Notes (Addendum)
Pt arriving POV. Pt states he had a wisdom tooth pulled 4 days ago, pt states he is now havign tingling/numbness in his right hand. Pt also reports pain/hearing loss on right side as well as ringing in his ear, nosebleed, and vision changes in the right eye all which began tonight. Pt reports he is seeing spots in his right eye.

## 2018-09-25 NOTE — ED Notes (Signed)
Patient transported to CT via stretcher.

## 2018-09-25 NOTE — ED Notes (Addendum)
Visual acuity 20/25 with glasses in both R and L eyes. Patient did report blurred spots in his vision throughout the test and with his R eye.

## 2018-09-26 DIAGNOSIS — H65191 Other acute nonsuppurative otitis media, right ear: Secondary | ICD-10-CM

## 2018-09-26 LAB — CBC
HCT: 47.6 % (ref 39.0–52.0)
Hemoglobin: 15.4 g/dL (ref 13.0–17.0)
MCH: 27.3 pg (ref 26.0–34.0)
MCHC: 32.4 g/dL (ref 30.0–36.0)
MCV: 84.2 fL (ref 80.0–100.0)
NRBC: 0 % (ref 0.0–0.2)
Platelets: 296 10*3/uL (ref 150–400)
RBC: 5.65 MIL/uL (ref 4.22–5.81)
RDW: 14 % (ref 11.5–15.5)
WBC: 11.1 10*3/uL — AB (ref 4.0–10.5)

## 2018-09-26 LAB — COMPREHENSIVE METABOLIC PANEL
ALBUMIN: 3.6 g/dL (ref 3.5–5.0)
ALT: 50 U/L — ABNORMAL HIGH (ref 0–44)
AST: 34 U/L (ref 15–41)
Alkaline Phosphatase: 54 U/L (ref 38–126)
Anion gap: 7 (ref 5–15)
BUN: 9 mg/dL (ref 6–20)
CHLORIDE: 104 mmol/L (ref 98–111)
CO2: 27 mmol/L (ref 22–32)
Calcium: 8.9 mg/dL (ref 8.9–10.3)
Creatinine, Ser: 1.19 mg/dL (ref 0.61–1.24)
GFR calc Af Amer: >60 mL/min (ref >60)
GFR calc non Af Amer: >60 mL/min (ref >60)
GLUCOSE: 105 mg/dL — AB (ref 70–99)
POTASSIUM: 3.7 mmol/L (ref 3.5–5.1)
SODIUM: 138 mmol/L (ref 135–145)
Total Bilirubin: 0.9 mg/dL (ref 0.3–1.2)
Total Protein: 7.6 g/dL (ref 6.5–8.1)

## 2018-09-26 LAB — PHOSPHORUS: Phosphorus: 3.9 mg/dL (ref 2.5–4.6)

## 2018-09-26 LAB — MAGNESIUM: Magnesium: 1.9 mg/dL (ref 1.7–2.4)

## 2018-09-26 MED ORDER — ASPIRIN 81 MG PO TBEC: 81.0000 mg | DELAYED_RELEASE_TABLET | Freq: Every day | ORAL | Status: DC

## 2018-09-26 MED ORDER — FLUTICASONE PROPIONATE 50 MCG/ACT NA SUSP
2.0000 | Freq: Every day | NASAL | Status: DC
  Administered 2018-09-26: 2 via NASAL
  Filled 2018-09-26: qty 16

## 2018-09-26 MED ORDER — FLUTICASONE PROPIONATE 50 MCG/ACT NA SUSP: 2.0000 | Freq: Every day | NASAL | 0 refills | Status: DC
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
## 2018-09-26 NOTE — Plan of Care (Signed)
Pt verbalized understanding plan of care. Alert and oriented x4. Skin warm and dry. Pt ambulatory with steady gait. Pt voiced has returned back to baseline. Assessment mimics patient return to baseline of 4/5 Right Arm strength, compared to 5/5 left arm strength. Diagnostic tests improving. Pt shows ability  to manage health-related needs. No complications arise during hospital stay. Pt adequate for discharge.
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
## 2020-04-14 ENCOUNTER — Emergency Department (HOSPITAL_COMMUNITY)
Admission: EM | Admit: 2020-04-14 | Discharge: 2020-04-14 | Disposition: A | Discharge status: Home / Self Care | Payer: Medicaid Other | Attending: Emergency Medicine | Admitting: Emergency Medicine

## 2020-04-14 ENCOUNTER — Other Ambulatory Visit: Payer: Self-pay

## 2020-04-14 ENCOUNTER — Encounter (HOSPITAL_COMMUNITY): Payer: Self-pay

## 2020-04-14 DIAGNOSIS — Z7982 Long term (current) use of aspirin: Secondary | ICD-10-CM | POA: Insufficient documentation

## 2020-04-14 DIAGNOSIS — Y999 Unspecified external cause status: Secondary | ICD-10-CM | POA: Diagnosis not present

## 2020-04-14 DIAGNOSIS — X501XXA Overexertion from prolonged static or awkward postures, initial encounter: Secondary | ICD-10-CM | POA: Insufficient documentation

## 2020-04-14 DIAGNOSIS — Y92009 Unspecified place in unspecified non-institutional (private) residence as the place of occurrence of the external cause: Secondary | ICD-10-CM | POA: Insufficient documentation

## 2020-04-14 DIAGNOSIS — Y9389 Activity, other specified: Secondary | ICD-10-CM | POA: Diagnosis not present

## 2020-04-14 DIAGNOSIS — R42 Dizziness and giddiness: Secondary | ICD-10-CM | POA: Diagnosis not present

## 2020-04-14 DIAGNOSIS — R55 Syncope and collapse: Secondary | ICD-10-CM | POA: Diagnosis not present

## 2020-04-14 DIAGNOSIS — S060X9A Concussion with loss of consciousness of unspecified duration, initial encounter: Secondary | ICD-10-CM | POA: Diagnosis present

## 2020-04-14 LAB — CBC WITH DIFFERENTIAL/PLATELET
Abs Immature Granulocytes: 0.03 10*3/uL (ref 0.00–0.07)
Basophils Absolute: 0 10*3/uL (ref 0.0–0.1)
Basophils Relative: 0 %
Eosinophils Absolute: 0.1 10*3/uL (ref 0.0–0.5)
Eosinophils Relative: 1 %
HCT: 50.4 % (ref 39.0–52.0)
Hemoglobin: 16.1 g/dL (ref 13.0–17.0)
Immature Granulocytes: 0 %
Lymphocytes Relative: 35 %
Lymphs Abs: 3.6 10*3/uL (ref 0.7–4.0)
MCH: 27.2 pg (ref 26.0–34.0)
MCHC: 31.9 g/dL (ref 30.0–36.0)
MCV: 85.1 fL (ref 80.0–100.0)
Monocytes Absolute: 0.6 10*3/uL (ref 0.1–1.0)
Monocytes Relative: 6 %
Neutro Abs: 6 10*3/uL (ref 1.7–7.7)
Neutrophils Relative %: 58 %
Platelets: 312 10*3/uL (ref 150–400)
RBC: 5.92 MIL/uL — ABNORMAL HIGH (ref 4.22–5.81)
RDW: 14.5 % (ref 11.5–15.5)
WBC: 10.3 10*3/uL (ref 4.0–10.5)
nRBC: 0 % (ref 0.0–0.2)

## 2020-04-14 LAB — BASIC METABOLIC PANEL
Anion gap: 11 (ref 5–15)
BUN: 10 mg/dL (ref 6–20)
CO2: 26 mmol/L (ref 22–32)
Calcium: 9.2 mg/dL (ref 8.9–10.3)
Chloride: 104 mmol/L (ref 98–111)
Creatinine, Ser: 1.2 mg/dL (ref 0.61–1.24)
GFR calc Af Amer: >60 mL/min (ref >60)
GFR calc non Af Amer: >60 mL/min (ref >60)
Glucose, Bld: 98 mg/dL (ref 70–99)
Potassium: 4.1 mmol/L (ref 3.5–5.1)
Sodium: 141 mmol/L (ref 135–145)

## 2020-04-14 NOTE — Discharge Instructions (Signed)
Please return for any problem.  Follow-up with your regular care provider as instructed. °

## 2020-04-14 NOTE — ED Triage Notes (Signed)
Patient arrived stating he had a significant nose bleed tonight around 7pm causing him to lose consciousness. When waking up stating he has a metallic taste in his mouth. States his right side is tingling but able to move all extremities. Reporting spots in right visual field.
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
## 2020-07-08 ENCOUNTER — Other Ambulatory Visit: Payer: Self-pay

## 2020-07-08 ENCOUNTER — Inpatient Hospital Stay (HOSPITAL_COMMUNITY)
Admission: EM | Admit: 2020-07-08 | Discharge: 2020-07-13 | DRG: 177 | Disposition: A | Discharge status: Home / Self Care | Payer: Medicaid Other | Attending: Internal Medicine | Admitting: Internal Medicine

## 2020-07-08 DIAGNOSIS — J1282 Pneumonia due to coronavirus disease 2019: Secondary | ICD-10-CM | POA: Diagnosis present

## 2020-07-08 DIAGNOSIS — F4310 Post-traumatic stress disorder, unspecified: Secondary | ICD-10-CM | POA: Diagnosis present

## 2020-07-08 DIAGNOSIS — U071 COVID-19: Principal | ICD-10-CM | POA: Diagnosis present

## 2020-07-08 DIAGNOSIS — E669 Obesity, unspecified: Secondary | ICD-10-CM | POA: Diagnosis present

## 2020-07-08 DIAGNOSIS — E871 Hypo-osmolality and hyponatremia: Secondary | ICD-10-CM | POA: Diagnosis present

## 2020-07-08 DIAGNOSIS — Z7982 Long term (current) use of aspirin: Secondary | ICD-10-CM

## 2020-07-08 DIAGNOSIS — G8929 Other chronic pain: Secondary | ICD-10-CM | POA: Diagnosis present

## 2020-07-08 DIAGNOSIS — N179 Acute kidney failure, unspecified: Secondary | ICD-10-CM | POA: Diagnosis present

## 2020-07-08 DIAGNOSIS — J9601 Acute respiratory failure with hypoxia: Secondary | ICD-10-CM | POA: Diagnosis present

## 2020-07-08 DIAGNOSIS — A419 Sepsis, unspecified organism: Secondary | ICD-10-CM | POA: Diagnosis present

## 2020-07-08 DIAGNOSIS — Z79899 Other long term (current) drug therapy: Secondary | ICD-10-CM

## 2020-07-08 DIAGNOSIS — E861 Hypovolemia: Secondary | ICD-10-CM | POA: Diagnosis present

## 2020-07-08 DIAGNOSIS — Z6833 Body mass index (BMI) 33.0-33.9, adult: Secondary | ICD-10-CM

## 2020-07-08 DIAGNOSIS — R6510 Systemic inflammatory response syndrome (SIRS) of non-infectious origin without acute organ dysfunction: Secondary | ICD-10-CM

## 2020-07-08 NOTE — ED Triage Notes (Signed)
Patient here with c/o cough, body aches, sob, n,v, loss of taste and smell, chills and fever.  Patient was 84% on ra, 91% on 4L via [place], 100, 114/60,cbg 122.

## 2020-07-09 ENCOUNTER — Other Ambulatory Visit: Payer: Self-pay

## 2020-07-09 ENCOUNTER — Encounter (HOSPITAL_COMMUNITY): Payer: Self-pay

## 2020-07-09 ENCOUNTER — Emergency Department (HOSPITAL_COMMUNITY): Payer: Medicaid Other

## 2020-07-09 DIAGNOSIS — Z79899 Other long term (current) drug therapy: Secondary | ICD-10-CM | POA: Diagnosis not present

## 2020-07-09 DIAGNOSIS — J9601 Acute respiratory failure with hypoxia: Secondary | ICD-10-CM | POA: Diagnosis present

## 2020-07-09 DIAGNOSIS — E861 Hypovolemia: Secondary | ICD-10-CM | POA: Diagnosis present

## 2020-07-09 DIAGNOSIS — E669 Obesity, unspecified: Secondary | ICD-10-CM | POA: Diagnosis present

## 2020-07-09 DIAGNOSIS — G8929 Other chronic pain: Secondary | ICD-10-CM | POA: Diagnosis present

## 2020-07-09 DIAGNOSIS — Z6833 Body mass index (BMI) 33.0-33.9, adult: Secondary | ICD-10-CM | POA: Diagnosis not present

## 2020-07-09 DIAGNOSIS — U071 COVID-19: Secondary | ICD-10-CM | POA: Diagnosis present

## 2020-07-09 DIAGNOSIS — E871 Hypo-osmolality and hyponatremia: Secondary | ICD-10-CM | POA: Diagnosis present

## 2020-07-09 DIAGNOSIS — N179 Acute kidney failure, unspecified: Secondary | ICD-10-CM | POA: Diagnosis present

## 2020-07-09 DIAGNOSIS — A419 Sepsis, unspecified organism: Secondary | ICD-10-CM | POA: Diagnosis present

## 2020-07-09 DIAGNOSIS — J1282 Pneumonia due to coronavirus disease 2019: Secondary | ICD-10-CM | POA: Diagnosis present

## 2020-07-09 DIAGNOSIS — F4310 Post-traumatic stress disorder, unspecified: Secondary | ICD-10-CM | POA: Diagnosis present

## 2020-07-09 DIAGNOSIS — R6510 Systemic inflammatory response syndrome (SIRS) of non-infectious origin without acute organ dysfunction: Secondary | ICD-10-CM | POA: Diagnosis present

## 2020-07-09 DIAGNOSIS — Z7982 Long term (current) use of aspirin: Secondary | ICD-10-CM | POA: Diagnosis not present

## 2020-07-09 LAB — COMPREHENSIVE METABOLIC PANEL
ALT: 44 U/L (ref 0–44)
AST: 76 U/L — ABNORMAL HIGH (ref 15–41)
Albumin: 2.8 g/dL — ABNORMAL LOW (ref 3.5–5.0)
Alkaline Phosphatase: 48 U/L (ref 38–126)
Anion gap: 10 (ref 5–15)
BUN: 18 mg/dL (ref 6–20)
CO2: 26 mmol/L (ref 22–32)
Calcium: 7.8 mg/dL — ABNORMAL LOW (ref 8.9–10.3)
Chloride: 95 mmol/L — ABNORMAL LOW (ref 98–111)
Creatinine, Ser: 1.36 mg/dL — ABNORMAL HIGH (ref 0.61–1.24)
GFR calc Af Amer: >60 mL/min (ref >60)
GFR calc non Af Amer: >60 mL/min (ref >60)
Glucose, Bld: 122 mg/dL — ABNORMAL HIGH (ref 70–99)
Potassium: 3.5 mmol/L (ref 3.5–5.1)
Sodium: 131 mmol/L — ABNORMAL LOW (ref 135–145)
Total Bilirubin: 1 mg/dL (ref 0.3–1.2)
Total Protein: 6.6 g/dL (ref 6.5–8.1)

## 2020-07-09 LAB — CBC WITH DIFFERENTIAL/PLATELET
Abs Immature Granulocytes: 0.04 10*3/uL (ref 0.00–0.07)
Basophils Absolute: 0 10*3/uL (ref 0.0–0.1)
Basophils Relative: 0 %
Eosinophils Absolute: 0 10*3/uL (ref 0.0–0.5)
Eosinophils Relative: 0 %
HCT: 46.3 % (ref 39.0–52.0)
Hemoglobin: 15.4 g/dL (ref 13.0–17.0)
Immature Granulocytes: 1 %
Lymphocytes Relative: 13 %
Lymphs Abs: 0.9 10*3/uL (ref 0.7–4.0)
MCH: 27 pg (ref 26.0–34.0)
MCHC: 33.3 g/dL (ref 30.0–36.0)
MCV: 81.2 fL (ref 80.0–100.0)
Monocytes Absolute: 0.2 10*3/uL (ref 0.1–1.0)
Monocytes Relative: 4 %
Neutro Abs: 5.5 10*3/uL (ref 1.7–7.7)
Neutrophils Relative %: 82 %
Platelets: 173 10*3/uL (ref 150–400)
RBC: 5.7 MIL/uL (ref 4.22–5.81)
RDW: 14.1 % (ref 11.5–15.5)
WBC: 6.6 10*3/uL (ref 4.0–10.5)
nRBC: 0 % (ref 0.0–0.2)

## 2020-07-09 LAB — LACTATE DEHYDROGENASE: LDH: 472 U/L — ABNORMAL HIGH (ref 98–192)

## 2020-07-09 LAB — D-DIMER, QUANTITATIVE: D-Dimer, Quant: 0.91 ug/mL-FEU — ABNORMAL HIGH (ref 0.00–0.50)

## 2020-07-09 LAB — SARS CORONAVIRUS 2 BY RT PCR (HOSPITAL ORDER, PERFORMED IN ~~LOC~~ HOSPITAL LAB): SARS Coronavirus 2: POSITIVE — AB

## 2020-07-09 LAB — FERRITIN: Ferritin: 632 ng/mL — ABNORMAL HIGH (ref 24–336)

## 2020-07-09 LAB — HIV ANTIBODY (ROUTINE TESTING W REFLEX): HIV Screen 4th Generation wRfx: NONREACTIVE

## 2020-07-09 LAB — FIBRINOGEN: Fibrinogen: 581 mg/dL — ABNORMAL HIGH (ref 210–475)

## 2020-07-09 LAB — C-REACTIVE PROTEIN: CRP: 2.5 mg/dL — ABNORMAL HIGH (ref <1.0)

## 2020-07-09 LAB — LACTIC ACID, PLASMA: Lactic Acid, Venous: 1.2 mmol/L (ref 0.5–1.9)

## 2020-07-09 LAB — PROCALCITONIN: Procalcitonin: <0.1 ng/mL

## 2020-07-09 LAB — TRIGLYCERIDES: Triglycerides: 87 mg/dL (ref <150)

## 2020-07-09 MED ORDER — ALBUTEROL SULFATE HFA 108 (90 BASE) MCG/ACT IN AERS: 2.0000 | INHALATION_SPRAY | Freq: Four times a day (QID) | RESPIRATORY_TRACT | Status: DC | PRN

## 2020-07-09 MED ORDER — VITAMIN D 25 MCG (1000 UNIT) PO TABS
1000.0000 [IU] | ORAL_TABLET | Freq: Every day | ORAL | Status: DC
  Administered 2020-07-09 – 2020-07-13 (×5): 1000 [IU] via ORAL
  Filled 2020-07-09 (×5): qty 1

## 2020-07-09 MED ORDER — ZINC SULFATE 220 (50 ZN) MG PO CAPS
220.0000 mg | ORAL_CAPSULE | Freq: Every day | ORAL | Status: DC
  Administered 2020-07-09 – 2020-07-13 (×5): 220 mg via ORAL
  Filled 2020-07-09 (×5): qty 1

## 2020-07-09 MED ORDER — AMLODIPINE BESYLATE 5 MG PO TABS
2.5000 mg | ORAL_TABLET | Freq: Every day | ORAL | Status: DC
  Administered 2020-07-09 – 2020-07-13 (×5): 2.5 mg via ORAL
  Filled 2020-07-09 (×5): qty 1

## 2020-07-09 MED ORDER — OXYCODONE HCL 5 MG PO TABS
15.0000 mg | ORAL_TABLET | Freq: Every day | ORAL | Status: DC
  Administered 2020-07-09 – 2020-07-13 (×20): 15 mg via ORAL
  Filled 2020-07-09 (×20): qty 3

## 2020-07-09 MED ORDER — METHYLPREDNISOLONE SODIUM SUCC 125 MG IJ SOLR
0.5000 mg/kg | Freq: Two times a day (BID) | INTRAMUSCULAR | Status: AC
  Administered 2020-07-09 – 2020-07-11 (×6): 51.25 mg via INTRAVENOUS
  Filled 2020-07-09 (×6): qty 2

## 2020-07-09 MED ORDER — SODIUM CHLORIDE 0.9 % IV SOLN
200.0000 mg | Freq: Once | INTRAVENOUS | Status: AC
  Administered 2020-07-09: 200 mg via INTRAVENOUS
  Filled 2020-07-09: qty 200

## 2020-07-09 MED ORDER — PREDNISONE 20 MG PO TABS
50.0000 mg | ORAL_TABLET | Freq: Every day | ORAL | Status: AC
  Administered 2020-07-12 – 2020-07-13 (×2): 50 mg via ORAL
  Filled 2020-07-09 (×2): qty 2

## 2020-07-09 MED ORDER — ENOXAPARIN SODIUM 40 MG/0.4ML ~~LOC~~ SOLN
40.0000 mg | SUBCUTANEOUS | Status: DC
  Administered 2020-07-09 – 2020-07-13 (×5): 40 mg via SUBCUTANEOUS
  Filled 2020-07-09 (×5): qty 0.4

## 2020-07-09 MED ORDER — BUPRENORPHINE HCL 900 MCG BU FILM
900.0000 ug | ORAL_FILM | Freq: Two times a day (BID) | BUCCAL | Status: DC
  Administered 2020-07-10: 900 ug via BUCCAL

## 2020-07-09 MED ORDER — ACETAMINOPHEN 500 MG PO TABS
1000.0000 mg | ORAL_TABLET | Freq: Once | ORAL | Status: AC
  Administered 2020-07-09: 1000 mg via ORAL
  Filled 2020-07-09: qty 2

## 2020-07-09 MED ORDER — ACETAMINOPHEN 325 MG PO TABS: 650.0000 mg | ORAL_TABLET | Freq: Four times a day (QID) | ORAL | Status: DC | PRN

## 2020-07-09 MED ORDER — SODIUM CHLORIDE 0.9 % IV SOLN: INTRAVENOUS | Status: DC

## 2020-07-09 MED ORDER — SODIUM CHLORIDE 0.9 % IV SOLN
100.0000 mg | Freq: Every day | INTRAVENOUS | Status: AC
  Administered 2020-07-10 – 2020-07-13 (×4): 100 mg via INTRAVENOUS
  Filled 2020-07-09 (×4): qty 20

## 2020-07-09 MED ORDER — GUAIFENESIN-DM 100-10 MG/5ML PO SYRP
10.0000 mL | ORAL_SOLUTION | ORAL | Status: DC | PRN
  Administered 2020-07-09 – 2020-07-13 (×4): 10 mL via ORAL
  Filled 2020-07-09 (×4): qty 10

## 2020-07-09 MED ORDER — HYDROCOD POLST-CPM POLST ER 10-8 MG/5ML PO SUER
5.0000 mL | Freq: Two times a day (BID) | ORAL | Status: DC | PRN
  Administered 2020-07-09 – 2020-07-12 (×4): 5 mL via ORAL
  Filled 2020-07-09 (×4): qty 5

## 2020-07-09 MED ORDER — ONDANSETRON HCL 4 MG/2ML IJ SOLN: 4.0000 mg | Freq: Four times a day (QID) | INTRAMUSCULAR | Status: DC | PRN

## 2020-07-09 MED ORDER — ASCORBIC ACID 500 MG PO TABS
500.0000 mg | ORAL_TABLET | Freq: Every day | ORAL | Status: DC
  Administered 2020-07-09 – 2020-07-13 (×5): 500 mg via ORAL
  Filled 2020-07-09 (×5): qty 1
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
## 2020-07-09 NOTE — ED Notes (Signed)
Attempted report, RN in another pt room. Will call back.

## 2020-07-09 NOTE — ED Notes (Signed)
Patient is resting comfortably. 

## 2020-07-09 NOTE — ED Notes (Signed)
Admitting provider at the bedside at this time to evaluate the patient. °
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
## 2020-07-10 ENCOUNTER — Telehealth: Payer: Self-pay | Admitting: Nurse Practitioner

## 2020-07-10 LAB — FERRITIN: Ferritin: 595 ng/mL — ABNORMAL HIGH (ref 24–336)

## 2020-07-10 LAB — CBC WITH DIFFERENTIAL/PLATELET
Abs Immature Granulocytes: 0.05 10*3/uL (ref 0.00–0.07)
Basophils Absolute: 0 10*3/uL (ref 0.0–0.1)
Basophils Relative: 0 %
Eosinophils Absolute: 0 10*3/uL (ref 0.0–0.5)
Eosinophils Relative: 0 %
HCT: 48.2 % (ref 39.0–52.0)
Hemoglobin: 15.6 g/dL (ref 13.0–17.0)
Immature Granulocytes: 1 %
Lymphocytes Relative: 14 %
Lymphs Abs: 0.9 10*3/uL (ref 0.7–4.0)
MCH: 27 pg (ref 26.0–34.0)
MCHC: 32.4 g/dL (ref 30.0–36.0)
MCV: 83.4 fL (ref 80.0–100.0)
Monocytes Absolute: 0.2 10*3/uL (ref 0.1–1.0)
Monocytes Relative: 3 %
Neutro Abs: 5.4 10*3/uL (ref 1.7–7.7)
Neutrophils Relative %: 82 %
Platelets: 206 10*3/uL (ref 150–400)
RBC: 5.78 MIL/uL (ref 4.22–5.81)
RDW: 14.3 % (ref 11.5–15.5)
WBC: 6.6 10*3/uL (ref 4.0–10.5)
nRBC: 0 % (ref 0.0–0.2)

## 2020-07-10 LAB — COMPREHENSIVE METABOLIC PANEL
ALT: 49 U/L — ABNORMAL HIGH (ref 0–44)
AST: 57 U/L — ABNORMAL HIGH (ref 15–41)
Albumin: 2.7 g/dL — ABNORMAL LOW (ref 3.5–5.0)
Alkaline Phosphatase: 50 U/L (ref 38–126)
Anion gap: 9 (ref 5–15)
BUN: 20 mg/dL (ref 6–20)
CO2: 26 mmol/L (ref 22–32)
Calcium: 8.2 mg/dL — ABNORMAL LOW (ref 8.9–10.3)
Chloride: 101 mmol/L (ref 98–111)
Creatinine, Ser: 1.11 mg/dL (ref 0.61–1.24)
GFR calc Af Amer: >60 mL/min (ref >60)
GFR calc non Af Amer: >60 mL/min (ref >60)
Glucose, Bld: 158 mg/dL — ABNORMAL HIGH (ref 70–99)
Potassium: 4.3 mmol/L (ref 3.5–5.1)
Sodium: 136 mmol/L (ref 135–145)
Total Bilirubin: 0.7 mg/dL (ref 0.3–1.2)
Total Protein: 6.4 g/dL — ABNORMAL LOW (ref 6.5–8.1)

## 2020-07-10 LAB — D-DIMER, QUANTITATIVE: D-Dimer, Quant: 0.66 ug/mL-FEU — ABNORMAL HIGH (ref 0.00–0.50)

## 2020-07-10 LAB — C-REACTIVE PROTEIN: CRP: 2.2 mg/dL — ABNORMAL HIGH (ref <1.0)
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
## 2020-07-10 NOTE — Progress Notes (Signed)
Patient up to bathroom, O2 sat dropped to 79%. Assisted back to bed, O2/2L and positioned on side. Sats 90%. Will continue to monitor.
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
## 2020-07-10 NOTE — Progress Notes (Addendum)
[name]  CBJ:[id number] DOB: 10-13-1971 DOA: 07/08/2020 PCP: Patient, No Pcp Per    Brief Narrative:  48yo with a history of obesity, PTSD, and chronic pain who presented to the ED via EMS with shortness of breath and a Covid-like illness. Oxygen saturations were found to be 84% on room air and improved to 91% with 4 L of supplemental oxygen. Patient reported feeling ill for 5-6 days prior to this admission. In the ED he was found to have a temperature of 102.6. Covid PCR testing was positive. A CXR noted patchy bilateral airspace disease consistent with Covid pneumonia.  Significant Events:  9/7 admit via [facility] ED  Date of Positive COVID Test:  07/09/20  Vaccination Status: NOT vaccinated  COVID-19 specific Treatment: Solu-Medrol 9/7 > Remdesivir 9/7 >  Antimicrobials:  None  DVT prophylaxis: Lovenox  Subjective: VSS. Saturations 96% on [place] support but dropped to 79% w/ ambulation to bathroom. Inflammatory markers trending downward.  Overall patient feels that he is slowly improving.  Assessment & Plan:  COVID Pneumonia -acute hypoxic respiratory failure Stabilizing with minimal oxygen support -continue remdesivir and Solu-Medrol -presently not yet a candidate for IL-6 or JAK inhibitor -wean oxygen as able but desaturates significantly with attempts to ambulate -may require home oxygen to allow discharge   Recent Labs  Lab 07/09/20 0024 07/10/20 0346  DDIMER 0.91* 0.66*  FERRITIN 632* 595*  CRP 2.5* 2.2*  ALT 44 49*  PROCALCITON <0.10  --     Sepsis (mentioned at admission) ruled out  Mild hyponatremia Felt to be simply due to hypovolemia - resolved with volume expansion  Mild transaminitis Likely a consequence of Covid itself or Covid plus remdesivir -follow trend  Acute kidney injury crt has normalized with volume expansion  Obesity - Body mass index is 33.23 kg/m.   Chronic pain Continuing usual home narcotic regimen -pain appears to be  well controlled   Code Status: FULL CODE Family Communication:  Status is: Inpatient  Remains inpatient appropriate because:Inpatient level of care appropriate due to severity of illness   Dispo: The patient is from: Home              Anticipated d/c is to: Home              Anticipated d/c date is: 1 day              Patient currently is not medically stable to d/c.   Consultants:  none  Objective: Blood pressure (!) 129/94, pulse 75, temperature 97.8 F (36.6 C), temperature source Oral, resp. rate 15, height 5\' 9"  (1.753 m), weight 102.1 kg, SpO2 96 %.  Intake/Output Summary (Last 24 hours) at 07/10/2020 1337 Last data filed at 07/10/2020 1215 Gross per 24 hour  Intake 880 ml  Output 950 ml  Net -70 ml   Filed Weights   07/09/20 0027  Weight: 102.1 kg    Examination: General: No acute respiratory distress at rest -alert and oriented Lungs: Fine diffuse crackles without wheezing Cardiovascular: Regular rate and rhythm without murmur gallop or rub normal S1 and S2 Abdomen: Nontender, nondistended, soft, bowel sounds positive, no rebound, no ascites, no appreciable mass Extremities: No significant cyanosis, clubbing, or edema bilateral lower extremities    CBC: Recent Labs  Lab 07/09/20 0024 07/10/20 0346  WBC 6.6 6.6  NEUTROABS 5.5 5.4  HGB 15.4 15.6  HCT 46.3 48.2  MCV 81.2 83.4  PLT 173 206   Basic Metabolic Panel: Recent Labs  Lab 07/09/20 0024 07/10/20 0346  NA 131* 136  K 3.5 4.3  CL 95* 101  CO2 26 26  GLUCOSE 122* 158*  BUN 18 20  CREATININE 1.36* 1.11  CALCIUM 7.8* 8.2*   GFR: Estimated Creatinine Clearance: 93.8 mL/min (by C-G formula based on SCr of 1.11 mg/dL).  Liver Function Tests: Recent Labs  Lab 07/09/20 0024 07/10/20 0346  AST 76* 57*  ALT 44 49*  ALKPHOS 48 50  BILITOT 1.0 0.7  PROT 6.6 6.4*  ALBUMIN 2.8* 2.7*    HbA1C: Hgb A1c MFr Bld  Date/Time Value Ref Range Status  09/25/2018 12:26 AM 5.7 (H) 4.8 - 5.6 % Final      Comment:    (NOTE) Pre diabetes:          5.7%-6.4% Diabetes:              >6.4% Glycemic control for   <7.0% adults with diabetes     CBG: No results for input(s): GLUCAP in the last 168 hours.  Recent Results (from the past 240 hour(s))  Blood Culture (routine x 2)     Status: None (Preliminary result)   Collection Time: 07/09/20 12:24 AM   Specimen: BLOOD  Result Value Ref Range Status   Specimen Description   Final    BLOOD BLOOD RIGHT ARM Performed at [facility], [address], [place], [place] [address]    Special Requests   Final    BOTTLES DRAWN AEROBIC AND ANAEROBIC Blood Culture adequate volume Performed at [facility], [address], [place], [place] [address]    Culture   Final    NO GROWTH 1 DAY Performed at [facility], [address] [place], [place] [address]    Report Status PENDING  Incomplete  Blood Culture (routine x 2)     Status: None (Preliminary result)   Collection Time: 07/09/20 12:29 AM   Specimen: BLOOD  Result Value Ref Range Status   Specimen Description   Final    BLOOD BLOOD RIGHT ARM Performed at [facility], [address] [place], [place] [address]    Special Requests   Final    BOTTLES DRAWN AEROBIC AND ANAEROBIC Blood Culture adequate volume Performed at [facility], [address], [place], [place] [address]    Culture   Final    NO GROWTH 1 DAY Performed at [facility], [address], [place], [place] [address]    Report Status PENDING  Incomplete  SARS Coronavirus 2 by RT PCR (hospital order, performed in [facility] hospital lab) Nasopharyngeal Nasopharyngeal Swab     Status: Abnormal   Collection Time: 07/09/20 12:46 AM   Specimen: Nasopharyngeal Swab  Result Value Ref Range Status   SARS Coronavirus 2 POSITIVE (A) NEGATIVE Final    Comment: RESULT CALLED TO, READ BACK BY AND VERIFIED WITH: ILENE HODGES @ 0237 ON 07/09/20  C VARNER (NOTE) SARS-CoV-2 target nucleic acids are DETECTED  SARS-CoV-2 RNA is generally detectable in upper respiratory specimens  during the acute phase of infection.  Positive results are indicative  of the presence of the identified virus, but do not rule out bacterial infection or co-infection with other pathogens not detected by the test.  Clinical correlation with patient history and  other diagnostic information is necessary to determine patient infection status.  The expected result is negative.  Fact Sheet for Patients:   BoilerBrush.com.cy   Fact Sheet for  Healthcare Providers:   https://pope.com/    This test is not yet approved or cleared by the [place] and  has been authorized for detection and/or diagnosis of SARS-CoV-2 by FDA under an Emergency Use Authorization (EUA).  This EUA will remain in effect (meaning thi s test can be used) for the duration of  the COVID-19 declaration under Section 564(b)(1) of the Act, 21 U.S.C. section 360-bbb-3(b)(1), unless the authorization is terminated or revoked sooner.  Performed at [facility], [address], [place], [place] [address]      Scheduled Meds: . amLODipine  2.5 mg Oral Daily  . vitamin C  500 mg Oral Daily  . Buprenorphine HCl  900 mcg Buccal BID  . cholecalciferol  1,000 Units Oral Daily  . enoxaparin (LOVENOX) injection  40 mg Subcutaneous Q24H  . methylPREDNISolone (SOLU-MEDROL) injection  0.5 mg/kg Intravenous Q12H   Followed by  . [START ON 07/12/2020] predniSONE  50 mg Oral Daily  . oxyCODONE  15 mg Oral 5 X Daily  . zinc sulfate  220 mg Oral Daily   Continuous Infusions: . remdesivir 100 mg in NS 100 mL 100 mg (07/10/20 1025)     LOS: 1 day   Lonia Blood, MD [facility] Office  [phone] Pager - Text Page per Loretha Stapler  If 7PM-7AM, please contact night-coverage per Amion 07/10/2020, 1:37 PM

## 2020-07-11 LAB — CBC WITH DIFFERENTIAL/PLATELET
Abs Immature Granulocytes: 0.11 10*3/uL — ABNORMAL HIGH (ref 0.00–0.07)
Basophils Absolute: 0 10*3/uL (ref 0.0–0.1)
Basophils Relative: 0 %
Eosinophils Absolute: 0 10*3/uL (ref 0.0–0.5)
Eosinophils Relative: 0 %
HCT: 45 % (ref 39.0–52.0)
Hemoglobin: 14.5 g/dL (ref 13.0–17.0)
Immature Granulocytes: 1 %
Lymphocytes Relative: 5 %
Lymphs Abs: 0.7 10*3/uL (ref 0.7–4.0)
MCH: 26.8 pg (ref 26.0–34.0)
MCHC: 32.2 g/dL (ref 30.0–36.0)
MCV: 83 fL (ref 80.0–100.0)
Monocytes Absolute: 0.4 10*3/uL (ref 0.1–1.0)
Monocytes Relative: 3 %
Neutro Abs: 11.7 10*3/uL — ABNORMAL HIGH (ref 1.7–7.7)
Neutrophils Relative %: 91 %
Platelets: 265 10*3/uL (ref 150–400)
RBC: 5.42 MIL/uL (ref 4.22–5.81)
RDW: 14.3 % (ref 11.5–15.5)
WBC: 13 10*3/uL — ABNORMAL HIGH (ref 4.0–10.5)
nRBC: 0 % (ref 0.0–0.2)

## 2020-07-11 LAB — COMPREHENSIVE METABOLIC PANEL
ALT: 40 U/L (ref 0–44)
AST: 36 U/L (ref 15–41)
Albumin: 2.7 g/dL — ABNORMAL LOW (ref 3.5–5.0)
Alkaline Phosphatase: 49 U/L (ref 38–126)
Anion gap: 9 (ref 5–15)
BUN: 22 mg/dL — ABNORMAL HIGH (ref 6–20)
CO2: 29 mmol/L (ref 22–32)
Calcium: 8 mg/dL — ABNORMAL LOW (ref 8.9–10.3)
Chloride: 101 mmol/L (ref 98–111)
Creatinine, Ser: 1.02 mg/dL (ref 0.61–1.24)
GFR calc Af Amer: >60 mL/min (ref >60)
GFR calc non Af Amer: >60 mL/min (ref >60)
Glucose, Bld: 153 mg/dL — ABNORMAL HIGH (ref 70–99)
Potassium: 3.9 mmol/L (ref 3.5–5.1)
Sodium: 139 mmol/L (ref 135–145)
Total Bilirubin: 0.6 mg/dL (ref 0.3–1.2)
Total Protein: 6.2 g/dL — ABNORMAL LOW (ref 6.5–8.1)

## 2020-07-11 LAB — C-REACTIVE PROTEIN: CRP: 0.8 mg/dL (ref <1.0)

## 2020-07-11 LAB — FERRITIN: Ferritin: 501 ng/mL — ABNORMAL HIGH (ref 24–336)

## 2020-07-11 LAB — D-DIMER, QUANTITATIVE: D-Dimer, Quant: 0.55 ug/mL-FEU — ABNORMAL HIGH (ref 0.00–0.50)

## 2020-07-11 MED ORDER — FUROSEMIDE 10 MG/ML IJ SOLN
40.0000 mg | Freq: Every day | INTRAMUSCULAR | Status: DC
  Administered 2020-07-12 – 2020-07-13 (×2): 40 mg via INTRAVENOUS
  Filled 2020-07-11 (×2): qty 4

## 2020-07-11 NOTE — Progress Notes (Signed)
[name]  MCE:[id number] DOB: Feb 14, 1971 DOA: 07/08/2020 PCP: Patient, No Pcp Per    Brief Narrative:  49yo with a history of obesity, PTSD, and chronic pain who presented to the ED via EMS with shortness of breath and a Covid-like illness. Oxygen saturations were found to be 84% on room air and improved to 91% with 4 L of supplemental oxygen. Patient reported feeling ill for 5-6 days prior to this admission. In the ED he was found to have a temperature of 102.6. Covid PCR testing was positive. A CXR noted patchy bilateral airspace disease consistent with Covid pneumonia.  Significant Events:  9/7 admit via [facility] ED  Date of Positive COVID Test:  07/09/20  Vaccination Status: NOT vaccinated  COVID-19 specific Treatment: Solu-Medrol 9/7 > Remdesivir 9/7 >  Antimicrobials:  None  DVT prophylaxis: Lovenox  Subjective: Afebrile with vital signs stable.  Yesterday when working with therapy the patient desaturated to 85% on room air when walking to the bathroom.  At that time even on 3 L his saturations dropped to 85% when ambulating. Continues to desaturate w/ exertion. Otherwise is doing well.   Assessment & Plan:  COVID Pneumonia -acute hypoxic respiratory failure Stabilizing with minimal oxygen support at rest but high O2 need w/ exertion - continue remdesivir and Solu-Medrol - presently not yet a candidate for IL-6 or JAK inhibitor -wean oxygen as able but desaturates significantly with attempts to ambulate -may require home oxygen to allow discharge   Recent Labs  Lab 07/09/20 0024 07/10/20 0346 07/11/20 0353  DDIMER 0.91* 0.66* 0.55*  FERRITIN 632* 595* 501*  CRP 2.5* 2.2* 0.8  ALT 44 49* 40  PROCALCITON <0.10  --   --     Sepsis (mentioned at admission) ruled out  Mild hyponatremia Felt to be simply due to hypovolemia - resolved with volume expansion  Mild transaminitis Likely a consequence of Covid itself or Covid plus remdesivir -resolved  Acute  kidney injury crt has normalized with volume expansion  Obesity - Body mass index is 33.23 kg/m.   Chronic pain Continuing usual home narcotic regimen -pain appears to be well controlled   Code Status: FULL CODE Family Communication:  Status is: Inpatient  Remains inpatient appropriate because:Inpatient level of care appropriate due to severity of illness   Dispo: The patient is from: Home              Anticipated d/c is to: Home              Anticipated d/c date is: 2 days              Patient currently is not medically stable to d/c.   Consultants:  none  Objective: Blood pressure 135/86, pulse 76, temperature (!) 97.4 F (36.3 C), temperature source Oral, resp. rate (!) 23, height 5\' 9"  (1.753 m), weight 102.1 kg, SpO2 96 %.  Intake/Output Summary (Last 24 hours) at 07/11/2020 0929 Last data filed at 07/10/2020 1833 Gross per 24 hour  Intake 120 ml  Output 450 ml  Net -330 ml   Filed Weights   07/09/20 0027  Weight: 102.1 kg    Examination: General: No acute respiratory distress at rest  Lungs: Fine diffuse crackles - no wheezing  Cardiovascular: Regular rate and rhythm - no M  Abdomen: NT/ND, soft, bs+, no mass Extremities: No significant edema B LE     CBC: Recent Labs  Lab 07/09/20 0024 07/10/20 0346 07/11/20 0353  WBC 6.6 6.6  13.0*  NEUTROABS 5.5 5.4 11.7*  HGB 15.4 15.6 14.5  HCT 46.3 48.2 45.0  MCV 81.2 83.4 83.0  PLT 173 206 265   Basic Metabolic Panel: Recent Labs  Lab 07/09/20 0024 07/10/20 0346 07/11/20 0353  NA 131* 136 139  K 3.5 4.3 3.9  CL 95* 101 101  CO2 26 26 29   GLUCOSE 122* 158* 153*  BUN 18 20 22*  CREATININE 1.36* 1.11 1.02  CALCIUM 7.8* 8.2* 8.0*   GFR: Estimated Creatinine Clearance: 102.1 mL/min (by C-G formula based on SCr of 1.02 mg/dL).  Liver Function Tests: Recent Labs  Lab 07/09/20 0024 07/10/20 0346 07/11/20 0353  AST 76* 57* 36  ALT 44 49* 40  ALKPHOS 48 50 49  BILITOT 1.0 0.7 0.6  PROT 6.6 6.4*  6.2*  ALBUMIN 2.8* 2.7* 2.7*    HbA1C: Hgb A1c MFr Bld  Date/Time Value Ref Range Status  09/25/2018 12:26 AM 5.7 (H) 4.8 - 5.6 % Final    Comment:    (NOTE) Pre diabetes:          5.7%-6.4% Diabetes:              >6.4% Glycemic control for   <7.0% adults with diabetes     CBG: No results for input(s): GLUCAP in the last 168 hours.  Recent Results (from the past 240 hour(s))  Blood Culture (routine x 2)     Status: None (Preliminary result)   Collection Time: 07/09/20 12:24 AM   Specimen: BLOOD  Result Value Ref Range Status   Specimen Description   Final    BLOOD BLOOD RIGHT ARM Performed at [facility], [address], [place], [place]    Special Requests   Final    BOTTLES DRAWN AEROBIC AND ANAEROBIC Blood Culture adequate volume Performed at [facility], [address], [place], [place]    Culture   Final    NO GROWTH 2 DAYS Performed at [facility], [address] [place], [place]    Report Status PENDING  Incomplete  Blood Culture (routine x 2)     Status: None (Preliminary result)   Collection Time: 07/09/20 12:29 AM   Specimen: BLOOD  Result Value Ref Range Status   Specimen Description   Final    BLOOD BLOOD RIGHT ARM Performed at [facility], [address] [place], [place]    Special Requests   Final    BOTTLES DRAWN AEROBIC AND ANAEROBIC Blood Culture adequate volume Performed at [facility], [address], [place], [place]    Culture   Final    NO GROWTH 2 DAYS Performed at [facility], [address] [place], [place]    Report Status PENDING  Incomplete  SARS Coronavirus 2 by RT PCR (hospital order, performed in [facility] hospital lab) Nasopharyngeal Nasopharyngeal Swab     Status: Abnormal   Collection Time: 07/09/20 12:46 AM   Specimen: Nasopharyngeal Swab  Result Value Ref Range Status    SARS Coronavirus 2 POSITIVE (A) NEGATIVE Final    Comment: RESULT CALLED TO, READ BACK BY AND VERIFIED WITH: ILENE HODGES @ 0237 ON 07/09/20 C VARNER (NOTE) SARS-CoV-2 target nucleic acids are DETECTED  SARS-CoV-2 RNA is generally detectable in upper respiratory specimens  during the acute phase of infection.  Positive results are indicative  of the presence of the identified virus, but do not  rule out bacterial infection or co-infection with other pathogens not detected by the test.  Clinical correlation with patient history and  other diagnostic information is necessary to determine patient infection status.  The expected result is negative.  Fact Sheet for Patients:   BoilerBrush.com.cy   Fact Sheet for Healthcare Providers:   https://pope.com/    This test is not yet approved or cleared by the [place] FDA and  has been authorized for detection and/or diagnosis of SARS-CoV-2 by FDA under an Emergency Use Authorization (EUA).  This EUA will remain in effect (meaning thi s test can be used) for the duration of  the COVID-19 declaration under Section 564(b)(1) of the Act, 21 U.S.C. section 360-bbb-3(b)(1), unless the authorization is terminated or revoked sooner.  Performed at [facility], [address], [place], [place] [address]      Scheduled Meds: . amLODipine  2.5 mg Oral Daily  . vitamin C  500 mg Oral Daily  . Buprenorphine HCl  900 mcg Buccal BID  . cholecalciferol  1,000 Units Oral Daily  . enoxaparin (LOVENOX) injection  40 mg Subcutaneous Q24H  . methylPREDNISolone (SOLU-MEDROL) injection  0.5 mg/kg Intravenous Q12H   Followed by  . [START ON 07/12/2020] predniSONE  50 mg Oral Daily  . oxyCODONE  15 mg Oral 5 X Daily  . zinc sulfate  220 mg Oral Daily   Continuous Infusions: . remdesivir 100 mg in NS 100 mL 100 mg (07/10/20 1025)     LOS: 2 days   Lonia Blood, MD [facility] Office  [phone] Pager - Text Page per Loretha Stapler  If 7PM-7AM, please contact night-coverage per Amion 07/11/2020, 9:29 AM

## 2020-07-11 NOTE — Progress Notes (Signed)
PHYSICAL THERAPY   SATURATION QUALIFICATIONS: (This note is used to comply with regulatory documentation for home oxygen)  Patient Saturations on Room Air at Rest = 83%  Patient Saturations on Room Air while Ambulating limited distance 25 feet up time < with Mod c/o weakness and 3/4 dyspnea = 78% a lot coughing  Patient Saturations on 4 Liters of oxygen while Ambulating = 88% Slow to no recovery to decrease back to 2 lts  Please briefly explain why patient needs home oxygen:  Pt requires 2 lts at rest and 4 lts with activity plus remain on 4 lts > 15 min recovery.  Still required 4 lts nasal > 15 min.  RN in room.  Felecia Shelling  PTA Acute  Rehabilitation Services Pager      [phone] Office      [phone]
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
## 2020-07-12 LAB — COMPREHENSIVE METABOLIC PANEL
ALT: 49 U/L — ABNORMAL HIGH (ref 0–44)
AST: 40 U/L (ref 15–41)
Albumin: 2.7 g/dL — ABNORMAL LOW (ref 3.5–5.0)
Alkaline Phosphatase: 59 U/L (ref 38–126)
Anion gap: 11 (ref 5–15)
BUN: 21 mg/dL — ABNORMAL HIGH (ref 6–20)
CO2: 28 mmol/L (ref 22–32)
Calcium: 8.2 mg/dL — ABNORMAL LOW (ref 8.9–10.3)
Chloride: 102 mmol/L (ref 98–111)
Creatinine, Ser: 0.86 mg/dL (ref 0.61–1.24)
GFR calc Af Amer: >60 mL/min (ref >60)
GFR calc non Af Amer: >60 mL/min (ref >60)
Glucose, Bld: 177 mg/dL — ABNORMAL HIGH (ref 70–99)
Potassium: 4.1 mmol/L (ref 3.5–5.1)
Sodium: 141 mmol/L (ref 135–145)
Total Bilirubin: 0.9 mg/dL (ref 0.3–1.2)
Total Protein: 6.1 g/dL — ABNORMAL LOW (ref 6.5–8.1)

## 2020-07-12 LAB — CBC WITH DIFFERENTIAL/PLATELET
Abs Immature Granulocytes: 0.12 10*3/uL — ABNORMAL HIGH (ref 0.00–0.07)
Basophils Absolute: 0 10*3/uL (ref 0.0–0.1)
Basophils Relative: 0 %
Eosinophils Absolute: 0 10*3/uL (ref 0.0–0.5)
Eosinophils Relative: 0 %
HCT: 45.5 % (ref 39.0–52.0)
Hemoglobin: 14.8 g/dL (ref 13.0–17.0)
Immature Granulocytes: 1 %
Lymphocytes Relative: 4 %
Lymphs Abs: 0.6 10*3/uL — ABNORMAL LOW (ref 0.7–4.0)
MCH: 26.8 pg (ref 26.0–34.0)
MCHC: 32.5 g/dL (ref 30.0–36.0)
MCV: 82.3 fL (ref 80.0–100.0)
Monocytes Absolute: 0.5 10*3/uL (ref 0.1–1.0)
Monocytes Relative: 4 %
Neutro Abs: 12.9 10*3/uL — ABNORMAL HIGH (ref 1.7–7.7)
Neutrophils Relative %: 91 %
Platelets: 295 10*3/uL (ref 150–400)
RBC: 5.53 MIL/uL (ref 4.22–5.81)
RDW: 14 % (ref 11.5–15.5)
WBC: 14.1 10*3/uL — ABNORMAL HIGH (ref 4.0–10.5)
nRBC: 0 % (ref 0.0–0.2)

## 2020-07-12 LAB — C-REACTIVE PROTEIN: CRP: 0.6 mg/dL (ref <1.0)

## 2020-07-12 LAB — FERRITIN: Ferritin: 373 ng/mL — ABNORMAL HIGH (ref 24–336)

## 2020-07-12 LAB — D-DIMER, QUANTITATIVE: D-Dimer, Quant: 0.45 ug/mL-FEU (ref 0.00–0.50)
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
## 2020-07-12 NOTE — Progress Notes (Signed)
[name]  OZD:[id number] DOB: 10/14/1971 DOA: 07/08/2020 PCP: Patient, No Pcp Per    Brief Narrative:  48yo with a history of obesity, PTSD, and chronic pain who presented to the ED via EMS with shortness of breath and a Covid-like illness. Oxygen saturations were found to be 84% on room air and improved to 91% with 4 L of supplemental oxygen. Patient reported feeling ill for 5-6 days prior to this admission. In the ED he was found to have a temperature of 102.6. Covid PCR testing was positive. A CXR noted patchy bilateral airspace disease consistent with Covid pneumonia.  Significant Events:  9/7 admit via [facility] ED  Date of Positive COVID Test:  07/09/20  Vaccination Status: NOT vaccinated  COVID-19 specific Treatment: Solu-Medrol 9/7 > Remdesivir 9/7 >  Antimicrobials:  None  DVT prophylaxis: Lovenox  Subjective: Profound desaturation with physical exertion remains a limiting factor.  Afebrile with vital signs stable otherwise.  Inflammatory markers continue to trend down. No new complaints.   Assessment & Plan:  COVID Pneumonia -acute hypoxic respiratory failure Stabilizing with minimal oxygen support at rest but high O2 need w/ exertion - continue remdesivir and Solu-Medrol - presently not yet a candidate for IL-6 or JAK inhibitor -wean oxygen as able but desaturates significantly with attempts to ambulate -may require home oxygen to allow discharge   Recent Labs  Lab 07/09/20 0024 07/10/20 0346 07/11/20 0353 07/12/20 0404  DDIMER 0.91* 0.66* 0.55* 0.45  FERRITIN 632* 595* 501* 373*  CRP 2.5* 2.2* 0.8 0.6  ALT 44 49* 40 49*  PROCALCITON <0.10  --   --   --     Sepsis (mentioned at admission) ruled out  Mild hyponatremia Felt to be simply due to hypovolemia - resolved with volume expansion  Mild transaminitis Likely a consequence of Covid itself or Covid plus remdesivir -resolved  Acute kidney injury crt has normalized with volume  expansion  Obesity - Body mass index is 33.23 kg/m.   Chronic pain Continuing usual home narcotic regimen -pain appears to be well controlled   Code Status: FULL CODE Family Communication:  Status is: Inpatient  Remains inpatient appropriate because:Inpatient level of care appropriate due to severity of illness   Dispo: The patient is from: Home              Anticipated d/c is to: Home              Anticipated d/c date is: 2 days              Patient currently is not medically stable to d/c.   Consultants:  none  Objective: Blood pressure (!) 130/97, pulse 75, temperature 98 F (36.7 C), temperature source Oral, resp. rate 15, height 5\' 9"  (1.753 m), weight 102.1 kg, SpO2 96 %.  Intake/Output Summary (Last 24 hours) at 07/12/2020 0922 Last data filed at 07/12/2020 0813 Gross per 24 hour  Intake 240 ml  Output 925 ml  Net -685 ml   Filed Weights   07/09/20 0027  Weight: 102.1 kg    Examination: General: No acute respiratory distress - a&o Lungs: Fine diffuse crackles w/o change  Cardiovascular: Regular rate and rhythm - no M  Abdomen: NT/ND, soft, bs+, no mass Extremities: No edema B LE     CBC: Recent Labs  Lab 07/10/20 0346 07/11/20 0353 07/12/20 0404  WBC 6.6 13.0* 14.1*  NEUTROABS 5.4 11.7* 12.9*  HGB 15.6 14.5 14.8  HCT 48.2 45.0 45.5  MCV 83.4 83.0 82.3  PLT 206 265 295   Basic Metabolic Panel: Recent Labs  Lab 07/10/20 0346 07/11/20 0353 07/12/20 0404  NA 136 139 141  K 4.3 3.9 4.1  CL 101 101 102  CO2 26 29 28   GLUCOSE 158* 153* 177*  BUN 20 22* 21*  CREATININE 1.11 1.02 0.86  CALCIUM 8.2* 8.0* 8.2*   GFR: Estimated Creatinine Clearance: 121.1 mL/min (by C-G formula based on SCr of 0.86 mg/dL).  Liver Function Tests: Recent Labs  Lab 07/09/20 0024 07/10/20 0346 07/11/20 0353 07/12/20 0404  AST 76* 57* 36 40  ALT 44 49* 40 49*  ALKPHOS 48 50 49 59  BILITOT 1.0 0.7 0.6 0.9  PROT 6.6 6.4* 6.2* 6.1*  ALBUMIN 2.8* 2.7* 2.7*  2.7*    HbA1C: Hgb A1c MFr Bld  Date/Time Value Ref Range Status  09/25/2018 12:26 AM 5.7 (H) 4.8 - 5.6 % Final    Comment:    (NOTE) Pre diabetes:          5.7%-6.4% Diabetes:              >6.4% Glycemic control for   <7.0% adults with diabetes      Recent Results (from the past 240 hour(s))  Blood Culture (routine x 2)     Status: None (Preliminary result)   Collection Time: 07/09/20 12:24 AM   Specimen: BLOOD  Result Value Ref Range Status   Specimen Description   Final    BLOOD BLOOD RIGHT ARM Performed at [facility], [address] [place], [place]    Special Requests   Final    BOTTLES DRAWN AEROBIC AND ANAEROBIC Blood Culture adequate volume Performed at [facility], [address] [place], [place]    Culture   Final    NO GROWTH 2 DAYS Performed at [facility], [address], [place], [place]    Report Status PENDING  Incomplete  Blood Culture (routine x 2)     Status: None (Preliminary result)   Collection Time: 07/09/20 12:29 AM   Specimen: BLOOD  Result Value Ref Range Status   Specimen Description   Final    BLOOD BLOOD RIGHT ARM Performed at [facility], [address], [place], [place]    Special Requests   Final    BOTTLES DRAWN AEROBIC AND ANAEROBIC Blood Culture adequate volume Performed at [facility], [address], [place], [place]    Culture   Final    NO GROWTH 2 DAYS Performed at [facility], [address] [place], [place]    Report Status PENDING  Incomplete  SARS Coronavirus 2 by RT PCR (hospital order, performed in [facility] hospital lab) Nasopharyngeal Nasopharyngeal Swab     Status: Abnormal   Collection Time: 07/09/20 12:46 AM   Specimen: Nasopharyngeal Swab  Result Value Ref Range Status   SARS Coronavirus 2 POSITIVE (A) NEGATIVE Final    Comment: RESULT CALLED TO, READ  BACK BY AND VERIFIED WITH: ILENE HODGES @ 0237 ON 07/09/20 C VARNER (NOTE) SARS-CoV-2 target nucleic acids are DETECTED  SARS-CoV-2 RNA is generally detectable in upper respiratory specimens  during the acute phase of infection.  Positive results are indicative  of the presence of the identified virus, but do not rule out bacterial infection or co-infection with other pathogens not detected by the test.  Clinical correlation with patient history  and  other diagnostic information is necessary to determine patient infection status.  The expected result is negative.  Fact Sheet for Patients:   BoilerBrush.com.cy   Fact Sheet for Healthcare Providers:   https://pope.com/    This test is not yet approved or cleared by the [place] FDA and  has been authorized for detection and/or diagnosis of SARS-CoV-2 by FDA under an Emergency Use Authorization (EUA).  This EUA will remain in effect (meaning thi s test can be used) for the duration of  the COVID-19 declaration under Section 564(b)(1) of the Act, 21 U.S.C. section 360-bbb-3(b)(1), unless the authorization is terminated or revoked sooner.  Performed at [facility], [address] [place], [place] [address]      Scheduled Meds: . amLODipine  2.5 mg Oral Daily  . vitamin C  500 mg Oral Daily  . Buprenorphine HCl  900 mcg Buccal BID  . cholecalciferol  1,000 Units Oral Daily  . enoxaparin (LOVENOX) injection  40 mg Subcutaneous Q24H  . furosemide  40 mg Intravenous Daily  . oxyCODONE  15 mg Oral 5 X Daily  . predniSONE  50 mg Oral Daily  . zinc sulfate  220 mg Oral Daily   Continuous Infusions: . remdesivir 100 mg in NS 100 mL 100 mg (07/11/20 1054)     LOS: 3 days   Lonia Blood, MD [facility] Office  [phone] Pager - Text Page per Loretha Stapler  If 7PM-7AM, please contact night-coverage per Amion 07/12/2020, 9:22 AM

## 2020-07-13 LAB — COMPREHENSIVE METABOLIC PANEL
ALT: 68 U/L — ABNORMAL HIGH (ref 0–44)
AST: 46 U/L — ABNORMAL HIGH (ref 15–41)
Albumin: 2.5 g/dL — ABNORMAL LOW (ref 3.5–5.0)
Alkaline Phosphatase: 64 U/L (ref 38–126)
Anion gap: 9 (ref 5–15)
BUN: 20 mg/dL (ref 6–20)
CO2: 29 mmol/L (ref 22–32)
Calcium: 8.2 mg/dL — ABNORMAL LOW (ref 8.9–10.3)
Chloride: 103 mmol/L (ref 98–111)
Creatinine, Ser: 1.01 mg/dL (ref 0.61–1.24)
GFR calc Af Amer: >60 mL/min (ref >60)
GFR calc non Af Amer: >60 mL/min (ref >60)
Glucose, Bld: 108 mg/dL — ABNORMAL HIGH (ref 70–99)
Potassium: 3.8 mmol/L (ref 3.5–5.1)
Sodium: 141 mmol/L (ref 135–145)
Total Bilirubin: 0.8 mg/dL (ref 0.3–1.2)
Total Protein: 5.9 g/dL — ABNORMAL LOW (ref 6.5–8.1)

## 2020-07-13 LAB — CBC WITH DIFFERENTIAL/PLATELET
Abs Immature Granulocytes: 0.15 10*3/uL — ABNORMAL HIGH (ref 0.00–0.07)
Basophils Absolute: 0 10*3/uL (ref 0.0–0.1)
Basophils Relative: 0 %
Eosinophils Absolute: 0 10*3/uL (ref 0.0–0.5)
Eosinophils Relative: 0 %
HCT: 46.2 % (ref 39.0–52.0)
Hemoglobin: 14.8 g/dL (ref 13.0–17.0)
Immature Granulocytes: 1 %
Lymphocytes Relative: 9 %
Lymphs Abs: 1.2 10*3/uL (ref 0.7–4.0)
MCH: 26.6 pg (ref 26.0–34.0)
MCHC: 32 g/dL (ref 30.0–36.0)
MCV: 83.1 fL (ref 80.0–100.0)
Monocytes Absolute: 0.5 10*3/uL (ref 0.1–1.0)
Monocytes Relative: 4 %
Neutro Abs: 12.1 10*3/uL — ABNORMAL HIGH (ref 1.7–7.7)
Neutrophils Relative %: 86 %
Platelets: 311 10*3/uL (ref 150–400)
RBC: 5.56 MIL/uL (ref 4.22–5.81)
RDW: 14 % (ref 11.5–15.5)
WBC: 14 10*3/uL — ABNORMAL HIGH (ref 4.0–10.5)
nRBC: 0 % (ref 0.0–0.2)

## 2020-07-13 LAB — C-REACTIVE PROTEIN: CRP: <0.5 mg/dL (ref <1.0)

## 2020-07-13 LAB — D-DIMER, QUANTITATIVE: D-Dimer, Quant: 0.45 ug/mL-FEU (ref 0.00–0.50)

## 2020-07-13 LAB — FERRITIN: Ferritin: 380 ng/mL — ABNORMAL HIGH (ref 24–336)

## 2020-07-13 MED ORDER — PREDNISONE 20 MG PO TABS: 40.0000 mg | ORAL_TABLET | Freq: Every day | ORAL | 0 refills | Status: AC

## 2020-07-13 MED ORDER — PREDNISONE 20 MG PO TABS: 40.0000 mg | ORAL_TABLET | Freq: Every day | ORAL | Status: DC

## 2020-07-13 MED ORDER — ACETAMINOPHEN 325 MG PO TABS: 650.0000 mg | ORAL_TABLET | Freq: Four times a day (QID) | ORAL | Status: AC | PRN

## 2020-07-13 MED ORDER — PREDNISONE 20 MG PO TABS: 20.0000 mg | ORAL_TABLET | Freq: Every day | ORAL | 0 refills | Status: AC

## 2020-07-13 MED ORDER — PREDNISONE 20 MG PO TABS: 20.0000 mg | ORAL_TABLET | Freq: Every day | ORAL | Status: DC

## 2020-07-13 NOTE — Discharge Summary (Signed)
DISCHARGE SUMMARY  [name]  MR#: [id number]  DOB:01-14-1971  Date of Admission: 07/08/2020 Date of Discharge: 07/13/2020  Attending Physician:Jalaya Sarver Silvestre Gunner, MD  Patient's JJH:ERDEYCX, No Pcp Per  Consults: none   Disposition: D/C home   Date of Positive COVID Test: 07/09/20  Date Quarantine Ends: 07/30/20  COVID-19 specific Treatment: Steroid 9/7 > 9/16 Remdesivir 9/7 > 9/11  Follow-up Appts:  Follow-up Information    [facility] Follow up on 07/21/2020.   Specialty: Internal Medicine Why: Monday at 8AM for your hospital follow up appointment. This will be via phone. They will reach out to you on Friday to confirm.  Call them to reachedule if this is not a good time or date Contact information: [address] [place] [address] [phone]       [facility] Follow up.   Why: agency will provide home oxygen Contact information: [address] [place] [address] [phone]        [facility] Follow up.   Why: please call to schedule a pcp visit. Contact information: [address] [place] [address] [phone]              Tests Needing Follow-up: -assess O2 saturations w/ exertion to determine if ongoing home O2 still required  -recheck LFTs in 5-7 days   Discharge Diagnoses: COVID Pneumonia acute hypoxic respiratory failure Mild hyponatremia Mild transaminitis Acute kidney injury Obesity - Body mass index is 33.23 kg/m. Chronic pain  Initial presentation: 48yo with a history of obesity, PTSD, and chronic pain who presented to the ED via EMS with shortness of breath and a Covid-like illness. Oxygen saturations were found to be 84% on room air and improved to 91% with 4 L of supplemental oxygen. Patient reported feeling ill for 5-6 days prior to this admission. In the ED he was found to have a temperature of  102.6. Covid PCR testing was positive. A CXR noted patchy bilateral airspace disease consistent with Covid pneumonia.  Hospital Course:  COVID Pneumonia -acute hypoxic respiratory failure Stabilizing with minimal oxygen support at rest but high O2 need w/ exertion - completed a course of remdesivir - to complete 10 day course of steroid - was not a candidate for IL-6 or JAK inhibitor - wean oxygen as able - sats 88% or better w/ exertion on 4L [place] support - home O2 arranged   Sepsis (mentioned at admission) - ruled out  Mild hyponatremia Felt to be simply due to hypovolemia - resolved with volume expansion  Mild transaminitis Likely a consequence of Covid itself or Covid plus remdesivir -waxing and waning but not severe  Acute kidney injury crt normalized with volume expansion  Obesity - Body mass index is 33.23 kg/m.   Chronic pain Continued usual home narcotic regimen during admit -pain well controlled   Allergies as of 07/13/2020   No Known Allergies     Medication List    TAKE these medications   acetaminophen 325 MG tablet Commonly known as: TYLENOL Take 2 tablets (650 mg total) by mouth every 6 (six) hours as needed for mild pain or headache (fever >/= 101).   amLODipine 2.5 MG tablet Commonly known as: NORVASC Take 2.5 mg by mouth daily.   BC HEADACHE POWDER PO Take 1 packet by mouth as needed (headache/pain).   Belbuca 900 MCG Film Generic drug: Buprenorphine HCl Take 900 mcg by mouth 2 (two)  times daily.   oxyCODONE 15 MG immediate release tablet Commonly known as: ROXICODONE Take 15 mg by mouth 5 (five) times daily.   predniSONE 20 MG tablet Commonly known as: DELTASONE Take 2 tablets (40 mg total) by mouth daily with breakfast for 2 days. Start taking on: July 14, 2020   predniSONE 20 MG tablet Commonly known as: DELTASONE Take 1 tablet (20 mg total) by mouth daily with breakfast for 2 days. Start taking on: July 16, 2020              Durable Medical Equipment  (From admission, onward)         Start     Ordered   07/13/20 1115  For home use only DME oxygen  Once       Question Answer Comment  Length of Need 6 Months   Mode or (Route) Nasal cannula   Liters per Minute 4   Frequency Continuous (stationary and portable oxygen unit needed)   Oxygen conserving device Yes   Oxygen delivery system Gas      07/13/20 1114          Day of Discharge BP 108/80 (BP Location: Right Arm)   Pulse 94   Temp 99.2 F (37.3 C) (Oral)   Resp 18   Ht 5\' 9"  (1.753 m)   Wt 102.1 kg   SpO2 96%   BMI 33.23 kg/m   Physical Exam: General: No acute respiratory distress Lungs: Clear to auscultation bilaterally without wheezes or crackles Cardiovascular: Regular rate and rhythm without murmur gallop or rub normal S1 and S2 Abdomen: Nontender, nondistended, soft, bowel sounds positive, no rebound, no ascites, no appreciable mass Extremities: No significant cyanosis, clubbing, or edema bilateral lower extremities  Basic Metabolic Panel: Recent Labs  Lab 07/09/20 0024 07/10/20 0346 07/11/20 0353 07/12/20 0404 07/13/20 0415  NA 131* 136 139 141 141  K 3.5 4.3 3.9 4.1 3.8  CL 95* 101 101 102 103  CO2 26 26 29 28 29   GLUCOSE 122* 158* 153* 177* 108*  BUN 18 20 22* 21* 20  CREATININE 1.36* 1.11 1.02 0.86 1.01  CALCIUM 7.8* 8.2* 8.0* 8.2* 8.2*    Liver Function Tests: Recent Labs  Lab 07/09/20 0024 07/10/20 0346 07/11/20 0353 07/12/20 0404 07/13/20 0415  AST 76* 57* 36 40 46*  ALT 44 49* 40 49* 68*  ALKPHOS 48 50 49 59 64  BILITOT 1.0 0.7 0.6 0.9 0.8  PROT 6.6 6.4* 6.2* 6.1* 5.9*  ALBUMIN 2.8* 2.7* 2.7* 2.7* 2.5*    CBC: Recent Labs  Lab 07/09/20 0024 07/10/20 0346 07/11/20 0353 07/12/20 0404 07/13/20 0415  WBC 6.6 6.6 13.0* 14.1* 14.0*  NEUTROABS 5.5 5.4 11.7* 12.9* 12.1*  HGB 15.4 15.6 14.5 14.8 14.8  HCT 46.3 48.2 45.0 45.5 46.2  MCV 81.2 83.4 83.0 82.3 83.1  PLT 173 206 265 295 311     Recent Results (from the past 240 hour(s))  Blood Culture (routine x 2)     Status: None (Preliminary result)   Collection Time: 07/09/20 12:24 AM   Specimen: BLOOD  Result Value Ref Range Status   Specimen Description   Final    BLOOD BLOOD RIGHT ARM Performed at [facility], [address], [place], [place]    Special Requests   Final    BOTTLES DRAWN AEROBIC AND ANAEROBIC Blood Culture adequate volume Performed at [facility], [address], [place], [place]    Culture  Final    NO GROWTH 3 DAYS Performed at [facility], [address] [place], [place] [address]    Report Status PENDING  Incomplete  Blood Culture (routine x 2)     Status: None (Preliminary result)   Collection Time: 07/09/20 12:29 AM   Specimen: BLOOD  Result Value Ref Range Status   Specimen Description   Final    BLOOD BLOOD RIGHT ARM Performed at [facility], [address], [place], [place] [address]    Special Requests   Final    BOTTLES DRAWN AEROBIC AND ANAEROBIC Blood Culture adequate volume Performed at [facility], [address] [place], [place] [address]    Culture   Final    NO GROWTH 3 DAYS Performed at [facility], [address], [place], [place] [address]    Report Status PENDING  Incomplete  SARS Coronavirus 2 by RT PCR (hospital order, performed in [facility] hospital lab) Nasopharyngeal Nasopharyngeal Swab     Status: Abnormal   Collection Time: 07/09/20 12:46 AM   Specimen: Nasopharyngeal Swab  Result Value Ref Range Status   SARS Coronavirus 2 POSITIVE (A) NEGATIVE Final    Comment: RESULT CALLED TO, READ BACK BY AND VERIFIED WITH: ILENE HODGES @ 0237 ON 07/09/20 C VARNER (NOTE) SARS-CoV-2 target nucleic acids are DETECTED  SARS-CoV-2 RNA is generally detectable in upper respiratory specimens  during the acute phase of infection.  Positive results are  indicative  of the presence of the identified virus, but do not rule out bacterial infection or co-infection with other pathogens not detected by the test.  Clinical correlation with patient history and  other diagnostic information is necessary to determine patient infection status.  The expected result is negative.  Fact Sheet for Patients:   BoilerBrush.com.cy   Fact Sheet for Healthcare Providers:   https://pope.com/    This test is not yet approved or cleared by the [place] FDA and  has been authorized for detection and/or diagnosis of SARS-CoV-2 by FDA under an Emergency Use Authorization (EUA).  This EUA will remain in effect (meaning thi s test can be used) for the duration of  the COVID-19 declaration under Section 564(b)(1) of the Act, 21 U.S.C. section 360-bbb-3(b)(1), unless the authorization is terminated or revoked sooner.  Performed at [facility], [address] [place], [place] [address]      Time spent in discharge (includes decision making & examination of pt): 35 minutes  07/13/2020, 1:54 PM   Lonia Blood, MD [facility] Office  [phone]

## 2020-07-13 NOTE — TOC Progression Note (Addendum)
Transition of Care [facility]) - Progression Note    Patient Details  Name: [name] MRN: [id number] Date of Birth: [birth year]  [facility] CM/SW Contact  Armanda Heritage, RN Phone Number: 07/13/2020, 12:08 PM  Clinical Narrative:   [facility] will deliver home oxygen. Patient referred to [facility] for pcp services. Clinic is closed on the weekend and CM is unable to schedule an appointment.    Expected Discharge Plan: Home/Self Care Barriers to Discharge: No Barriers Identified  Expected Discharge Plan and Services Expected Discharge Plan: Home/Self Care         Expected Discharge Date: 07/13/20               DME Arranged: Oxygen DME Agency: [facility] Date DME Agency Contacted: 07/13/20 Time DME Agency Contacted: 1208 Representative spoke with at DME Agency: on-call referral line             Social Determinants of Health (SDOH) Interventions    Readmission Risk Interventions No flowsheet data found.
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
## 2020-07-13 NOTE — Progress Notes (Signed)
Pt's SPO2 on RA at rest is 85%; SPO2 on RA while ambulating 86%. Pt is on 4L [place] with SPO2 while ambulating 88% and HR increased to 120 bpm. Pt is resting in chair at this time. Will continue to monitor.

## 2020-07-13 NOTE — Progress Notes (Signed)
SATURATION QUALIFICATIONS: (This note is used to comply with regulatory documentation for home oxygen)  Patient Saturations on Room Air at Rest = 85%  Patient Saturations on Room Air while Ambulating = 86%  Patient Saturations on 4 Liters of oxygen while Ambulating = 88%-90%  Please briefly explain why patient needs home oxygen: pt requires home O2 to maintain an O2 saturation between 88%-90% while at rest and with activity.

## 2020-07-13 NOTE — Discharge Instructions (Signed)
Date of Positive COVID Test: 07/09/20  Date Quarantine Ends: 07/30/20    COVID-19 COVID-19 is a respiratory infection that is caused by a virus called severe acute respiratory syndrome coronavirus 2 (SARS-CoV-2). The disease is also known as coronavirus disease or novel coronavirus. In some people, the virus may not cause any symptoms. In others, it may cause a serious infection. The infection can get worse quickly and can lead to complications, such as:  Pneumonia, or infection of the lungs.  Acute respiratory distress syndrome or ARDS. This is a condition in which fluid build-up in the lungs prevents the lungs from filling with air and passing oxygen into the blood.  Acute respiratory failure. This is a condition in which there is not enough oxygen passing from the lungs to the body or when carbon dioxide is not passing from the lungs out of the body.  Sepsis or septic shock. This is a serious bodily reaction to an infection.  Blood clotting problems.  Secondary infections due to bacteria or fungus.  Organ failure. This is when your body's organs stop working. The virus that causes COVID-19 is contagious. This means that it can spread from person to person through droplets from coughs and sneezes (respiratory secretions). What are the causes? This illness is caused by a virus. You may catch the virus by:  Breathing in droplets from an infected person. Droplets can be spread by a person breathing, speaking, singing, coughing, or sneezing.  Touching something, like a table or a doorknob, that was exposed to the virus (contaminated) and then touching your mouth, nose, or eyes. What increases the risk? Risk for infection You are more likely to be infected with this virus if you:  Are within 6 feet (2 meters) of a person with COVID-19.  Provide care for or live with a person who is infected with COVID-19.  Spend time in crowded indoor spaces or live in shared housing. Risk for  serious illness You are more likely to become seriously ill from the virus if you:  Are 56 years of age or older. The higher your age, the more you are at risk for serious illness.  Live in a nursing home or long-term care facility.  Have cancer.  Have a long-term (chronic) disease such as: ? Chronic lung disease, including chronic obstructive pulmonary disease or asthma. ? A long-term disease that lowers your body's ability to fight infection (immunocompromised). ? Heart disease, including heart failure, a condition in which the arteries that lead to the heart become narrow or blocked (coronary artery disease), a disease which makes the heart muscle thick, weak, or stiff (cardiomyopathy). ? Diabetes. ? Chronic kidney disease. ? Sickle cell disease, a condition in which red blood cells have an abnormal "sickle" shape. ? Liver disease.  Are obese. What are the signs or symptoms? Symptoms of this condition can range from mild to severe. Symptoms may appear any time from 2 to 14 days after being exposed to the virus. They include:  A fever or chills.  A cough.  Difficulty breathing.  Headaches, body aches, or muscle aches.  Runny or stuffy (congested) nose.  A sore throat.  New loss of taste or smell. Some people may also have stomach problems, such as nausea, vomiting, or diarrhea. Other people may not have any symptoms of COVID-19. How is this diagnosed? This condition may be diagnosed based on:  Your signs and symptoms, especially if: ? You live in an area with a COVID-19 outbreak. ? You  recently traveled to or from an area where the virus is common. ? You provide care for or live with a person who was diagnosed with COVID-19. ? You were exposed to a person who was diagnosed with COVID-19.  A physical exam.  Lab tests, which may include: ? Taking a sample of fluid from the back of your nose and throat (nasopharyngeal fluid), your nose, or your throat using a  swab. ? A sample of mucus from your lungs (sputum). ? Blood tests.  Imaging tests, which may include, X-rays, CT scan, or ultrasound. How is this treated? At present, there is no medicine to treat COVID-19. Medicines that treat other diseases are being used on a trial basis to see if they are effective against COVID-19. Your health care provider will talk with you about ways to treat your symptoms. For most people, the infection is mild and can be managed at home with rest, fluids, and over-the-counter medicines. Treatment for a serious infection usually takes places in a hospital intensive care unit (ICU). It may include one or more of the following treatments. These treatments are given until your symptoms improve.  Receiving fluids and medicines through an IV.  Supplemental oxygen. Extra oxygen is given through a tube in the nose, a face mask, or a hood.  Positioning you to lie on your stomach (prone position). This makes it easier for oxygen to get into the lungs.  Continuous positive airway pressure (CPAP) or bi-level positive airway pressure (BPAP) machine. This treatment uses mild air pressure to keep the airways open. A tube that is connected to a motor delivers oxygen to the body.  Ventilator. This treatment moves air into and out of the lungs by using a tube that is placed in your windpipe.  Tracheostomy. This is a procedure to create a hole in the neck so that a breathing tube can be inserted.  Extracorporeal membrane oxygenation (ECMO). This procedure gives the lungs a chance to recover by taking over the functions of the heart and lungs. It supplies oxygen to the body and removes carbon dioxide. Follow these instructions at home: Lifestyle  If you are sick, stay home except to get medical care. Your health care provider will tell you how long to stay home. Call your health care provider before you go for medical care.  Rest at home as told by your health care provider.  Do  not use any products that contain nicotine or tobacco, such as cigarettes, e-cigarettes, and chewing tobacco. If you need help quitting, ask your health care provider.  Return to your normal activities as told by your health care provider. Ask your health care provider what activities are safe for you. General instructions  Take over-the-counter and prescription medicines only as told by your health care provider.  Drink enough fluid to keep your urine pale yellow.  Keep all follow-up visits as told by your health care provider. This is important. How is this prevented?  There is no vaccine to help prevent COVID-19 infection. However, there are steps you can take to protect yourself and others from this virus. To protect yourself:   Do not travel to areas where COVID-19 is a risk. The areas where COVID-19 is reported change often. To identify high-risk areas and travel restrictions, check the CDC travel website: FatFares.com.br  If you live in, or must travel to, an area where COVID-19 is a risk, take precautions to avoid infection. ? Stay away from people who are sick. ? Wash  your hands often with soap and water for 20 seconds. If soap and water are not available, use an alcohol-based hand sanitizer. ? Avoid touching your mouth, face, eyes, or nose. ? Avoid going out in public, follow guidance from your state and local health authorities. ? If you must go out in public, wear a cloth face covering or face mask. Make sure your mask covers your nose and mouth. ? Avoid crowded indoor spaces. Stay at least 6 feet (2 meters) away from others. ? Disinfect objects and surfaces that are frequently touched every day. This may include:  Counters and tables.  Doorknobs and light switches.  Sinks and faucets.  Electronics, such as phones, remote controls, keyboards, computers, and tablets. To protect others: If you have symptoms of COVID-19, take steps to prevent the virus from  spreading to others.  If you think you have a COVID-19 infection, contact your health care provider right away. Tell your health care team that you think you may have a COVID-19 infection.  Stay home. Leave your house only to seek medical care. Do not use public transport.  Do not travel while you are sick.  Wash your hands often with soap and water for 20 seconds. If soap and water are not available, use alcohol-based hand sanitizer.  Stay away from other members of your household. Let healthy household members care for children and pets, if possible. If you have to care for children or pets, wash your hands often and wear a mask. If possible, stay in your own room, separate from others. Use a different bathroom.  Make sure that all people in your household wash their hands well and often.  Cough or sneeze into a tissue or your sleeve or elbow. Do not cough or sneeze into your hand or into the air.  Wear a cloth face covering or face mask. Make sure your mask covers your nose and mouth. Where to find more information  Centers for Disease Control and Prevention: PurpleGadgets.be  World Health Organization: https://www.castaneda.info/ Contact a health care provider if:  You live in or have traveled to an area where COVID-19 is a risk and you have symptoms of the infection.  You have had contact with someone who has COVID-19 and you have symptoms of the infection. Get help right away if:  You have trouble breathing.  You have pain or pressure in your chest.  You have confusion.  You have bluish lips and fingernails.  You have difficulty waking from sleep.  You have symptoms that get worse. These symptoms may represent a serious problem that is an emergency. Do not wait to see if the symptoms will go away. Get medical help right away. Call your local emergency services (911 in the [place] Do not drive yourself to the hospital. Let the  emergency medical personnel know if you think you have COVID-19. Summary  COVID-19 is a respiratory infection that is caused by a virus. It is also known as coronavirus disease or novel coronavirus. It can cause serious infections, such as pneumonia, acute respiratory distress syndrome, acute respiratory failure, or sepsis.  The virus that causes COVID-19 is contagious. This means that it can spread from person to person through droplets from breathing, speaking, singing, coughing, or sneezing.  You are more likely to develop a serious illness if you are 23 years of age or older, have a weak immune system, live in a nursing home, or have chronic disease.  There is no medicine to treat COVID-19.  Your health care provider will talk with you about ways to treat your symptoms.  Take steps to protect yourself and others from infection. Wash your hands often and disinfect objects and surfaces that are frequently touched every day. Stay away from people who are sick and wear a mask if you are sick. This information is not intended to replace advice given to you by your health care provider. Make sure you discuss any questions you have with your health care provider. Document Revised: 08/17/2019 Document Reviewed: 11/23/2018 Elsevier Patient Education  2020 Elsevier Inc.   COVID-19: How to Protect Yourself and Others Know how it spreads  There is currently no vaccine to prevent coronavirus disease 2019 (COVID-19).  The best way to prevent illness is to avoid being exposed to this virus.  The virus is thought to spread mainly from person-to-person. ? Between people who are in close contact with one another (within about 6 feet). ? Through respiratory droplets produced when an infected person coughs, sneezes or talks. ? These droplets can land in the mouths or noses of people who are nearby or possibly be inhaled into the lungs. ? COVID-19 may be spread by people who are not showing  symptoms. Everyone should Clean your hands often  Wash your hands often with soap and water for at least 20 seconds especially after you have been in a public place, or after blowing your nose, coughing, or sneezing.  If soap and water are not readily available, use a hand sanitizer that contains at least 60% alcohol. Cover all surfaces of your hands and rub them together until they feel dry.  Avoid touching your eyes, nose, and mouth with unwashed hands. Avoid close contact  Limit contact with others as much as possible.  Avoid close contact with people who are sick.  Put distance between yourself and other people. ? Remember that some people without symptoms may be able to spread virus. ? This is especially important for people who are at higher risk of getting very RetroStamps.it Cover your mouth and nose with a mask when around others  You could spread COVID-19 to others even if you do not feel sick.  Everyone should wear a mask in public settings and when around people not living in their household, especially when social distancing is difficult to maintain. ? Masks should not be placed on young children under age 37, anyone who has trouble breathing, or is unconscious, incapacitated or otherwise unable to remove the mask without assistance.  The mask is meant to protect other people in case you are infected.  Do NOT use a facemask meant for a Research scientist (physical sciences).  Continue to keep about 6 feet between yourself and others. The mask is not a substitute for social distancing. Cover coughs and sneezes  Always cover your mouth and nose with a tissue when you cough or sneeze or use the inside of your elbow.  Throw used tissues in the trash.  Immediately wash your hands with soap and water for at least 20 seconds. If soap and water are not readily available, clean your hands with a hand sanitizer that contains  at least 60% alcohol. Clean and disinfect  Clean AND disinfect frequently touched surfaces daily. This includes tables, doorknobs, light switches, countertops, handles, desks, phones, keyboards, toilets, faucets, and sinks. ktimeonline.com  If surfaces are dirty, clean them: Use detergent or soap and water prior to disinfection.  Then, use a household disinfectant. You can see a list of EPA-registered household  disinfectants here. SouthAmericaFlowers.co.uk 07/04/2019 This information is not intended to replace advice given to you by your health care provider. Make sure you discuss any questions you have with your health care provider. Document Revised: 07/12/2019 Document Reviewed: 05/10/2019 Elsevier Patient Education  2020 [employer].

## 2020-07-13 NOTE — Progress Notes (Signed)
Pt discharged to home. Pt stated that he did not have anyone that would be able to pick him up from the hospital. Pt's family member dropped the pts Zenaida Niece off in the ED parking lot for pt to drive home. RN and NT pushed pt out to ED parking lot and instructed him on the safety of driving home on oxygen. Pt was d/c on home oxygen with 4 mini tanks of O2. Discharge instructions and medication education provided to pt.

## 2020-07-16 LAB — CULTURE, BLOOD (ROUTINE X 2)
Culture: NO GROWTH
Culture: NO GROWTH
Special Requests: ADEQUATE
Special Requests: ADEQUATE

## 2020-07-21 ENCOUNTER — Telehealth: Payer: Medicaid Other | Admitting: Nurse Practitioner

## 2021-07-26 IMAGING — DX DG CHEST 1V PORT
1 series · 1 of 1 positions shown · non-contrast
Comparison: None.

CLINICAL DATA: Hypoxia, fever, COVID

EXAM:
PORTABLE CHEST 1 VIEW

[chest ap]
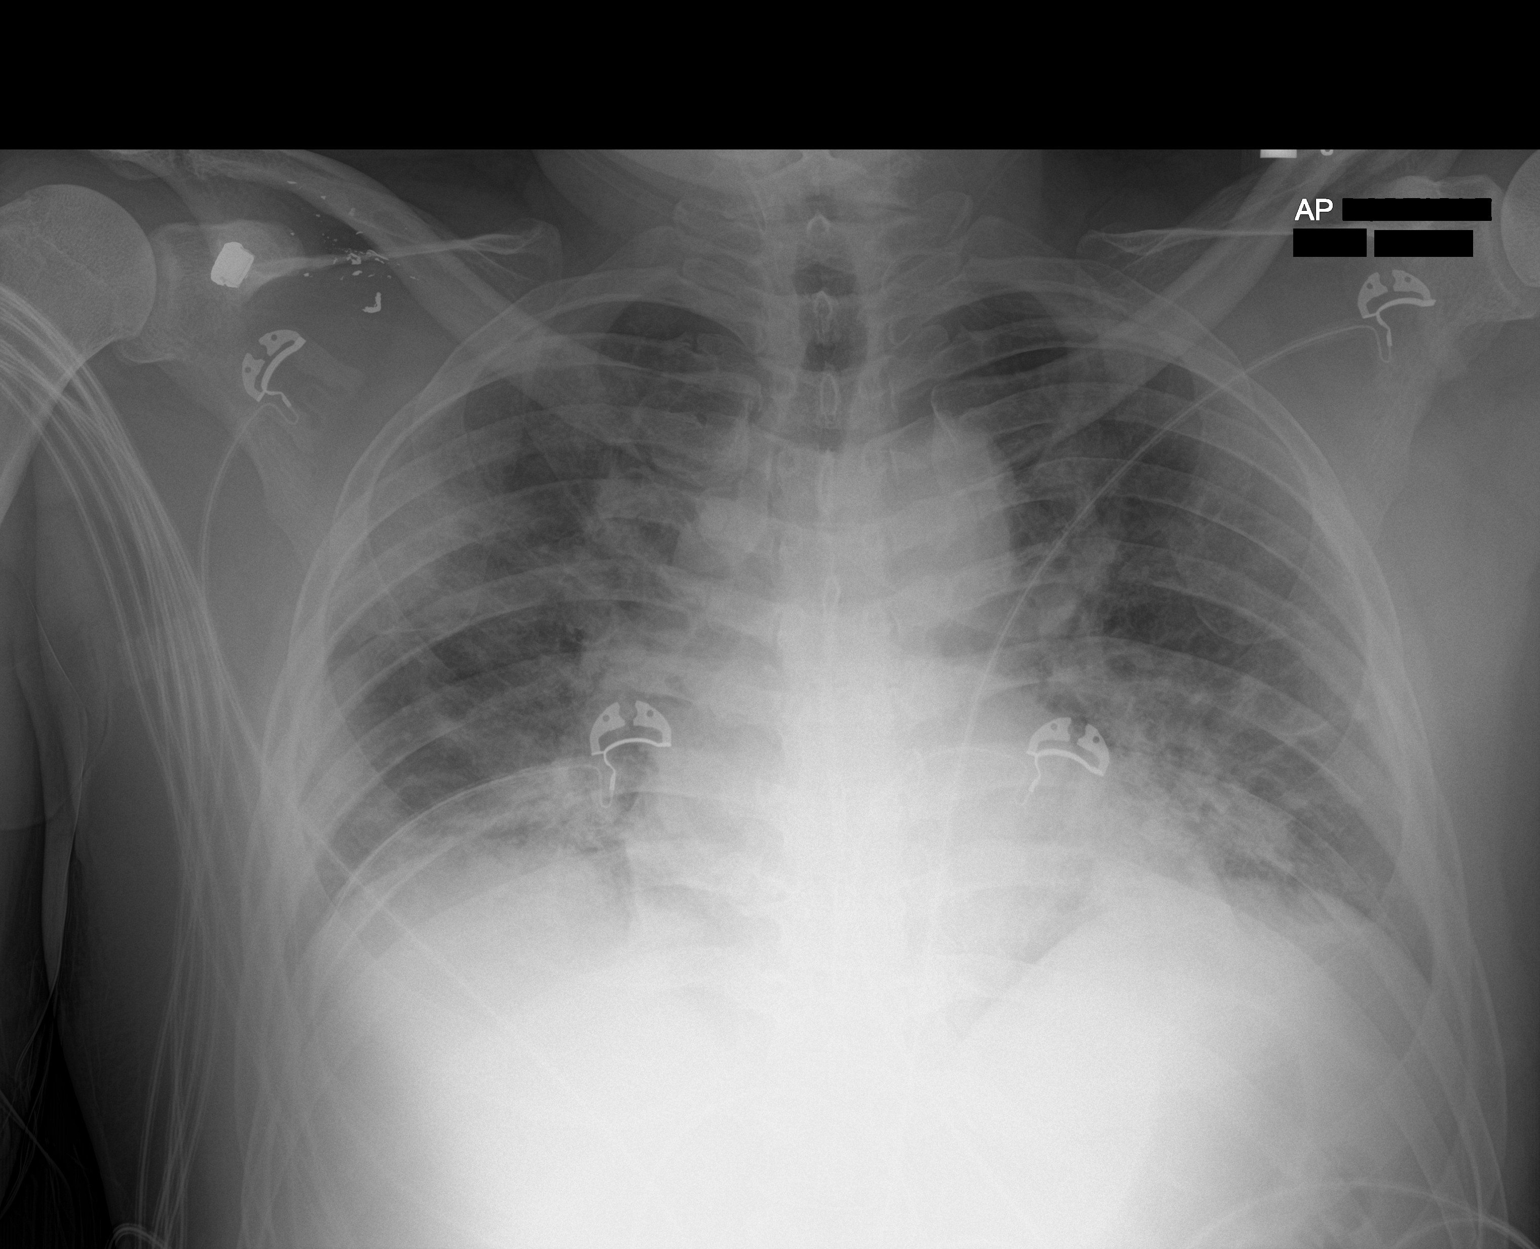

[1 of 1 positions shown; findings below may reference images not displayed]

FINDINGS: Heart is normal size. Low lung volumes. Patchy bilateral airspace
disease. No effusions or pneumothorax. No acute bony abnormality.
IMPRESSION: Patchy bilateral airspace disease and low lung volumes. Findings
compatible with COVID pneumonia.

## 2022-07-09 ENCOUNTER — Encounter (HOSPITAL_COMMUNITY): Payer: Self-pay

## 2022-07-09 ENCOUNTER — Emergency Department (HOSPITAL_COMMUNITY): Payer: Medicaid Other

## 2022-07-09 ENCOUNTER — Other Ambulatory Visit: Payer: Self-pay

## 2022-07-09 ENCOUNTER — Emergency Department (HOSPITAL_COMMUNITY)
Admission: EM | Admit: 2022-07-09 | Discharge: 2022-07-10 | Discharge status: Against Medical Advice | Payer: Medicaid Other | Attending: Emergency Medicine | Admitting: Emergency Medicine

## 2022-07-09 DIAGNOSIS — R079 Chest pain, unspecified: Secondary | ICD-10-CM | POA: Diagnosis not present

## 2022-07-09 DIAGNOSIS — Z5321 Procedure and treatment not carried out due to patient leaving prior to being seen by health care provider: Secondary | ICD-10-CM | POA: Diagnosis not present

## 2022-07-09 DIAGNOSIS — R55 Syncope and collapse: Secondary | ICD-10-CM | POA: Diagnosis present

## 2022-07-09 NOTE — ED Provider Triage Note (Signed)
Emergency Medicine Provider Triage Evaluation Note  [name] , a 51 y.o. male  was evaluated in triage.  Pt complains of syncopal episode today.  He states that he does not know how long he was unconscious for.  He was found by his neighbors and woken up.  He came to the emergency room.  He states that he had chest pain prior to the syncopal episode.  He was in his car during the entire episode.  He states he has chest pain that is left-sided does not radiate to his back/is nonradiating.  He denies any shortness of breath nausea or vomiting.  Denies any lower extremity swelling.  No numbness or weakness no slurred speech per patient.    Review of Systems  Positive: Chest pain, syncope Negative: Fever  Physical Exam  BP (!) 160/114   Pulse 90   Temp 98.4 F (36.9 C) (Oral)   Resp 18   Ht 5\' 8"  (1.727 m)   Wt 99.8 kg   SpO2 99%   BMI 33.45 kg/m  Gen:   Awake, no distress   Resp:  Normal effort  MSK:   Moves extremities without difficulty Other:  Bilateral radial artery pulses 2+ and symmetric, sensation intact in all 4 extremities, smile symmetric, bilateral upper and lower extremity strength symmetric.  Moves all 4 extremities with appropriate proprioception.  Medical Decision Making  Medically screening exam initiated at 10:00 PM.  Appropriate orders placed.  Nevan Creighton Gilder was informed that the remainder of the evaluation will be completed by another provider, this initial triage assessment does not replace that evaluation, and the importance of remaining in the ED until their evaluation is complete.  Workup initiated -- will keep on monitor until roomed.    Christianne Borrow [place], DOLE 07/09/22 2205
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
## 2024-06-25 ENCOUNTER — Emergency Department (HOSPITAL_COMMUNITY)

## 2024-06-25 ENCOUNTER — Emergency Department (HOSPITAL_COMMUNITY)
Admission: EM | Admit: 2024-06-25 | Discharge: 2024-06-25 | Disposition: A | Discharge status: Home / Self Care | Source: Ambulatory Visit | Attending: Emergency Medicine | Admitting: Emergency Medicine

## 2024-06-25 ENCOUNTER — Other Ambulatory Visit: Payer: Self-pay

## 2024-06-25 DIAGNOSIS — R1012 Left upper quadrant pain: Secondary | ICD-10-CM | POA: Insufficient documentation

## 2024-06-25 DIAGNOSIS — R109 Unspecified abdominal pain: Secondary | ICD-10-CM

## 2024-06-25 LAB — CBC WITH DIFFERENTIAL/PLATELET
Abs Immature Granulocytes: 0.04 K/uL (ref 0.00–0.07)
Basophils Absolute: 0.1 K/uL (ref 0.0–0.1)
Basophils Relative: 1 %
Eosinophils Absolute: 0.1 K/uL (ref 0.0–0.5)
Eosinophils Relative: 1 %
HCT: 48.7 % (ref 39.0–52.0)
Hemoglobin: 15.3 g/dL (ref 13.0–17.0)
Immature Granulocytes: 0 %
Lymphocytes Relative: 28 %
Lymphs Abs: 2.6 K/uL (ref 0.7–4.0)
MCH: 26 pg (ref 26.0–34.0)
MCHC: 31.4 g/dL (ref 30.0–36.0)
MCV: 82.8 fL (ref 80.0–100.0)
Monocytes Absolute: 0.6 K/uL (ref 0.1–1.0)
Monocytes Relative: 7 %
Neutro Abs: 6 K/uL (ref 1.7–7.7)
Neutrophils Relative %: 63 %
Platelets: 344 K/uL (ref 150–400)
RBC: 5.88 MIL/uL — ABNORMAL HIGH (ref 4.22–5.81)
RDW: 15.2 % (ref 11.5–15.5)
WBC: 9.4 K/uL (ref 4.0–10.5)
nRBC: 0 % (ref 0.0–0.2)

## 2024-06-25 LAB — URINALYSIS, ROUTINE W REFLEX MICROSCOPIC
Bacteria, UA: NONE SEEN
Bilirubin Urine: NEGATIVE
Glucose, UA: NEGATIVE mg/dL
Ketones, ur: NEGATIVE mg/dL
Leukocytes,Ua: NEGATIVE
Nitrite: NEGATIVE
Protein, ur: NEGATIVE mg/dL
Specific Gravity, Urine: 1.024 (ref 1.005–1.030)
pH: 5 (ref 5.0–8.0)

## 2024-06-25 LAB — LIPASE, BLOOD: Lipase: 28 U/L (ref 11–51)

## 2024-06-25 LAB — COMPREHENSIVE METABOLIC PANEL WITH GFR
ALT: 19 U/L (ref 0–44)
AST: 15 U/L (ref 15–41)
Albumin: 4.2 g/dL (ref 3.5–5.0)
Alkaline Phosphatase: 58 U/L (ref 38–126)
Anion gap: 7 (ref 5–15)
BUN: 12 mg/dL (ref 6–20)
CO2: 24 mmol/L (ref 22–32)
Calcium: 9.5 mg/dL (ref 8.9–10.3)
Chloride: 106 mmol/L (ref 98–111)
Creatinine, Ser: 1.09 mg/dL (ref 0.61–1.24)
GFR, Estimated: >60 mL/min (ref >60)
Glucose, Bld: 97 mg/dL (ref 70–99)
Potassium: 4 mmol/L (ref 3.5–5.1)
Sodium: 137 mmol/L (ref 135–145)
Total Bilirubin: 0.6 mg/dL (ref 0.0–1.2)
Total Protein: 7.9 g/dL (ref 6.5–8.1)

## 2024-06-25 MED ORDER — LIDOCAINE-EPINEPHRINE (PF) 2 %-1:200000 IJ SOLN
10.0000 mL | Freq: Once | INTRAMUSCULAR | Status: DC
  Filled 2024-06-25: qty 20

## 2024-06-25 MED ORDER — DOXYCYCLINE HYCLATE 100 MG PO CAPS: 100.0000 mg | ORAL_CAPSULE | Freq: Two times a day (BID) | ORAL | 0 refills | Status: AC

## 2024-06-25 MED ORDER — IOHEXOL 300 MG/ML  SOLN
100.0000 mL | Freq: Once | INTRAMUSCULAR | Status: AC | PRN
  Administered 2024-06-25: 100 mL via INTRAVENOUS

## 2024-06-25 NOTE — ED Provider Triage Note (Signed)
 Emergency Medicine Provider Triage Evaluation Note  [name] , a 53 y.o. male  was evaluated in triage.  Pt complains of LUQ abd pain 7/10 following dog bite.  Recommended ED evaluation, CT scan for persistent pain, concern for abscess following dog bite on 1 week ago. UTD on rabies  Last tdap 6 mo ago. Was not evaluated following bite until today  Took oxycodone  15mg  for pain  Review of Systems  Positive: See hpi Negative: fevers  Physical Exam  BP (!) 168/109 (BP Location: Left Arm)   Pulse (!) 101   Temp 98.4 F (36.9 C) (Oral)   Resp 16   Ht 5' 8 (1.727 m)   Wt 102.1 kg   SpO2 97%   BMI 34.21 kg/m  Gen:   Awake, no distress   Resp:  Normal effort  MSK:   Moves extremities without difficulty  Other:  Superficial lacerations to LUQ. Lacs are wo purulent drainage nor surrounding cellulitis  Medical Decision Making  Medically screening exam initiated at 4:19 PM.  Appropriate orders placed.  [name] was informed that the remainder of the evaluation will be completed by another provider, this initial triage assessment does not replace that evaluation, and the importance of remaining in the ED until their evaluation is complete.  Labs and ct ordered   Minnie Tinnie BRAVO, [place] 06/25/24 1622

## 2024-06-25 NOTE — ED Triage Notes (Signed)
 Pt was bit by his dog about a week ago in his stomach. Stomach is now swollen and hurts. Dogs rabies were UTD. Pt with scratch marks to abdomen. Denies any fevers. States his abdomen hurts when he moves and he heard a pop when he moved.

## 2024-06-25 NOTE — Discharge Instructions (Addendum)
 Return here for fever, redness to the area, drainage, vomiting, any problems

## 2024-06-25 NOTE — ED Provider Notes (Addendum)
 [place] [facility] AT [facility] Provider Note   CSN: [id number] Arrival date & time: 06/25/24  1612     Patient presents with: Wound Infection   [name] is a 53 y.o. male.   53 year old male presents with pain to his left upper quadrant.  Patient was attacked by his dog and had a laceration to that area.  Did not seek any medical care.  Since that time he has had dull constant pain.  Denies any fever or chills.  No drainage from the wound itself.  Wound has not been erythematous.  States that the pain is worse to move around.  Was a pop.  No treatment use prior to arrival patient's tetanus status is current       Prior to Admission medications   Medication Sig Start Date End Date Taking? Authorizing Provider  acetaminophen  (TYLENOL ) 325 MG tablet Take 2 tablets (650 mg total) by mouth every 6 (six) hours as needed for mild pain or headache (fever >/= 101). 07/13/20   Danton Reyes DASEN, MD  amLODipine  (NORVASC ) 2.5 MG tablet Take 2.5 mg by mouth daily. 05/15/20   [provider]  Aspirin -Salicylamide-Caffeine (BC HEADACHE POWDER PO) Take 1 packet by mouth as needed (headache/pain).    [provider]  BELBUCA  900 MCG FILM Take 900 mcg by mouth 2 (two) times daily. 06/24/20   [provider]  oxyCODONE  (ROXICODONE ) 15 MG immediate release tablet Take 15 mg by mouth 5 (five) times daily. 06/20/20   [provider]    Allergies: Patient has no known allergies.    Review of Systems  All other systems reviewed and are negative.   Updated Vital Signs BP (!) 168/109 (BP Location: Left Arm)   Pulse (!) 101   Temp 98.4 F (36.9 C) (Oral)   Resp 16   Ht 1.727 m (5' 8)   Wt 102.1 kg   SpO2 97%   BMI 34.21 kg/m   Physical Exam Vitals and nursing note reviewed.  Constitutional:      General: He is not in acute distress.    Appearance: Normal appearance. He is well-developed. He is not toxic-appearing.  HENT:      Head: Normocephalic and atraumatic.  Eyes:     General: Lids are normal.     Conjunctiva/sclera: Conjunctivae normal.     Pupils: Pupils are equal, round, and reactive to light.  Neck:     Thyroid: No thyroid mass.     Trachea: No tracheal deviation.  Cardiovascular:     Rate and Rhythm: Normal rate and regular rhythm.     Heart sounds: Normal heart sounds. No murmur heard.    No gallop.  Pulmonary:     Effort: Pulmonary effort is normal. No respiratory distress.     Breath sounds: Normal breath sounds. No stridor. No decreased breath sounds, wheezing, rhonchi or rales.  Abdominal:     General: There is no distension.     Palpations: Abdomen is soft.     Tenderness: There is no abdominal tenderness. There is no rebound.   Musculoskeletal:        General: No tenderness. Normal range of motion.     Cervical back: Normal range of motion and neck supple.  Skin:    General: Skin is warm and dry.     Findings: No abrasion or rash.  Neurological:     Mental Status: He is alert and oriented to person, place, and time. Mental status  is at baseline.     GCS: GCS eye subscore is 4. GCS verbal subscore is 5. GCS motor subscore is 6.     Cranial Nerves: No cranial nerve deficit.     Sensory: No sensory deficit.     Motor: Motor function is intact.  Psychiatric:        Attention and Perception: Attention normal.        Speech: Speech normal.        Behavior: Behavior normal.     (all labs ordered are listed, but only abnormal results are displayed) Labs Reviewed  CBC WITH DIFFERENTIAL/PLATELET - Abnormal; Notable for the following components:      Result Value   RBC 5.88 (*)    All other components within normal limits  URINALYSIS, ROUTINE W REFLEX MICROSCOPIC - Abnormal; Notable for the following components:   Hgb urine dipstick MODERATE (*)    All other components within normal limits  COMPREHENSIVE METABOLIC PANEL WITH GFR  LIPASE, BLOOD    EKG: None  Radiology: CT  ABDOMEN PELVIS W CONTRAST Result Date: 06/25/2024 CLINICAL DATA:  Left upper quadrant abdominal pain after a dog bite. Dog bite a week ago. Stomach is now swollen and hurts. Scratch marks to the abdomen. EXAM: CT ABDOMEN AND PELVIS WITH CONTRAST TECHNIQUE: Multidetector CT imaging of the abdomen and pelvis was performed using the standard protocol following bolus administration of intravenous contrast. RADIATION DOSE REDUCTION: This exam was performed according to the departmental dose-optimization program which includes automated exposure control, adjustment of the mA and/or kV according to patient size and/or use of iterative reconstruction technique. CONTRAST:  OMNIPAQUE  IOHEXOL  300 MG/ML  SOLN COMPARISON:  Abdominal radiographs 06/26/2009 FINDINGS: Lower chest: Mild interstitial changes to the lung bases may represent fibrosis or atelectasis. Hepatobiliary: Diffuse fatty infiltration of the liver. No focal liver lesions. Multiple gallstones. No gallbladder wall thickening or infiltration. No bile duct dilatation. Pancreas: Unremarkable. No pancreatic ductal dilatation or surrounding inflammatory changes. Spleen: Normal in size without focal abnormality. Adrenals/Urinary Tract: Adrenal glands are unremarkable. Kidneys are normal, without renal calculi, focal lesion, or hydronephrosis. Bladder is unremarkable. Stomach/Bowel: Stomach is within normal limits. Appendix appears normal. No evidence of bowel wall thickening, distention, or inflammatory changes. Vascular/Lymphatic: No significant vascular findings are present. No enlarged abdominal or pelvic lymph nodes. Reproductive: Prostate is unremarkable. Other: No free air or free fluid in the abdomen. Abdominal wall musculature appears intact. In the left anterior abdominal wall soft tissues, there is a subcutaneous collection measuring 2.3 x 8.9 cm. Mild stranding around the collection. This could represent hematoma or abscess and likely relates to the  history of penetrating injury. No active contrast extravasation is demonstrated. Musculoskeletal: Degenerative changes in the spine. No acute bony abnormalities. IMPRESSION: 1. Loculated collection in the subcutaneous fat over the left upper anterior abdominal wall. This is likely related to history of penetrating injury and may represent abscess or hematoma. No active extravasation of contrast material. 2. Otherwise, no acute process demonstrated in the abdomen or pelvis. No free air or free fluid. 3. Fatty infiltration of the liver. 4. Cholelithiasis without evidence of acute cholecystitis. Electronically Signed   By: Elsie Gravely M.D.   On: 06/25/2024 18:57     Fine needle aspiration  Date/Time: 06/25/2024 7:44 PM  Performed by: Dasie Faden, MD Authorized by: Dasie Faden, MD  Consent: Verbal consent obtained Risks and benefits: risks, benefits and alternatives were discussed Required items: required blood products, implants, devices, and special equipment available  Time out: Immediately prior to procedure a time out was called to verify the correct patient, procedure, equipment, support staff and site/side marked as required. Preparation: Patient was prepped and draped in the usual sterile fashion. Local anesthesia used: yes Anesthesia: local infiltration  Anesthesia: Local anesthesia used: yes Local Anesthetic: lidocaine  1% with epinephrine  Anesthetic total: 5 mL  Sedation: Patient sedated: no  Patient tolerance: patient tolerated the procedure well with no immediate complications      Medications Ordered in the ED  iohexol  (OMNIPAQUE ) 300 MG/ML solution 100 mL (100 mLs Intravenous Contrast Given 06/25/24 1741)                                    Medical Decision Making Risk Prescription drug management.   Patient's labs reviewed here and are significant for white count of 9.4 thousand.  Abdominal CT shows likely collection of subcutaneous fat in the left upper  anterior abdominal wall.  Questional abscess was hematoma.  Discussed with Dr. Netta with the general surgery.  Recommends needle aspiration.  I attempted this and got some dark blood.  No purulence noted.  Will still place patient on antibiotics and they will follow-up with general surgery     Final diagnoses:  None    ED Discharge Orders     None          Dasie Faden, MD 06/25/24 DAVIE    Dasie Faden, MD 06/25/24 1945

## 2024-10-02 ENCOUNTER — Other Ambulatory Visit (HOSPITAL_BASED_OUTPATIENT_CLINIC_OR_DEPARTMENT_OTHER): Payer: Self-pay

## 2024-10-02 MED ORDER — AMLODIPINE BESYLATE 5 MG PO TABS
5.0000 mg | ORAL_TABLET | Freq: Every day | ORAL | 1 refills | Status: AC
  Filled 2024-10-02: qty 90, 90d supply, fill #0

## 2024-10-02 MED ORDER — DEXCOM G6 TRANSMITTER MISC
3 refills | Status: AC
  Filled 2024-10-02: qty 1, 90d supply, fill #0

## 2024-10-02 MED ORDER — ACCU-CHEK SOFTCLIX LANCETS MISC
3 refills | Status: AC
  Filled 2024-10-02: qty 100, 30d supply, fill #0

## 2024-10-02 MED ORDER — METFORMIN HCL 500 MG PO TABS
500.0000 mg | ORAL_TABLET | Freq: Two times a day (BID) | ORAL | 0 refills | Status: AC
  Filled 2024-10-02: qty 180, 90d supply, fill #0

## 2024-10-02 MED ORDER — VITAMIN D (ERGOCALCIFEROL) 1.25 MG (50000 UNIT) PO CAPS
50000.0000 [IU] | ORAL_CAPSULE | ORAL | 2 refills | Status: AC
  Filled 2024-10-02: qty 12, 84d supply, fill #0

## 2024-10-02 MED ORDER — DEXCOM G6 SENSOR MISC
3 refills | Status: AC
  Filled 2024-10-02: qty 3, 30d supply, fill #0

## 2024-10-02 MED ORDER — ACCU-CHEK GUIDE W/DEVICE KIT
PACK | 0 refills | Status: AC
  Filled 2024-10-02: qty 1, 1d supply, fill #0

## 2024-10-02 MED ORDER — ALCOHOL PREP PADS
MEDICATED_PAD | 3 refills | Status: AC
  Filled 2024-10-02: qty 300, 90d supply, fill #0

## 2024-10-02 MED ORDER — ACCU-CHEK GUIDE TEST VI STRP
ORAL_STRIP | 3 refills | Status: AC
  Filled 2024-10-02: qty 100, 30d supply, fill #0
  Filled 2024-10-02: qty 100, 33d supply, fill #0

## 2024-10-02 MED ORDER — TRIAMTERENE-HCTZ 37.5-25 MG PO TABS
1.0000 | ORAL_TABLET | Freq: Every day | ORAL | 6 refills | Status: AC
  Filled 2024-10-02: qty 30, 30d supply, fill #0

## 2024-10-03 ENCOUNTER — Other Ambulatory Visit (HOSPITAL_BASED_OUTPATIENT_CLINIC_OR_DEPARTMENT_OTHER): Payer: Self-pay

## 2024-10-04 ENCOUNTER — Other Ambulatory Visit (HOSPITAL_BASED_OUTPATIENT_CLINIC_OR_DEPARTMENT_OTHER): Payer: Self-pay

## 2024-10-11 ENCOUNTER — Other Ambulatory Visit (HOSPITAL_BASED_OUTPATIENT_CLINIC_OR_DEPARTMENT_OTHER): Payer: Self-pay

## 2024-10-12 ENCOUNTER — Other Ambulatory Visit (HOSPITAL_BASED_OUTPATIENT_CLINIC_OR_DEPARTMENT_OTHER): Payer: Self-pay

## 2024-10-15 ENCOUNTER — Other Ambulatory Visit (HOSPITAL_BASED_OUTPATIENT_CLINIC_OR_DEPARTMENT_OTHER): Payer: Self-pay

## 2024-10-16 ENCOUNTER — Other Ambulatory Visit (HOSPITAL_BASED_OUTPATIENT_CLINIC_OR_DEPARTMENT_OTHER): Payer: Self-pay

## 2024-10-17 ENCOUNTER — Other Ambulatory Visit (HOSPITAL_BASED_OUTPATIENT_CLINIC_OR_DEPARTMENT_OTHER): Payer: Self-pay

## 2024-10-19 ENCOUNTER — Other Ambulatory Visit (HOSPITAL_BASED_OUTPATIENT_CLINIC_OR_DEPARTMENT_OTHER): Payer: Self-pay

## 2024-10-22 ENCOUNTER — Other Ambulatory Visit (HOSPITAL_BASED_OUTPATIENT_CLINIC_OR_DEPARTMENT_OTHER): Payer: Self-pay

## 2024-10-23 ENCOUNTER — Other Ambulatory Visit (HOSPITAL_BASED_OUTPATIENT_CLINIC_OR_DEPARTMENT_OTHER): Payer: Self-pay

## 2024-10-24 ENCOUNTER — Other Ambulatory Visit (HOSPITAL_BASED_OUTPATIENT_CLINIC_OR_DEPARTMENT_OTHER): Payer: Self-pay

## 2024-10-26 ENCOUNTER — Other Ambulatory Visit (HOSPITAL_BASED_OUTPATIENT_CLINIC_OR_DEPARTMENT_OTHER): Payer: Self-pay

## 2024-10-30 ENCOUNTER — Other Ambulatory Visit: Payer: Self-pay

## 2024-10-30 ENCOUNTER — Other Ambulatory Visit (HOSPITAL_BASED_OUTPATIENT_CLINIC_OR_DEPARTMENT_OTHER): Payer: Self-pay

## 2024-10-31 ENCOUNTER — Other Ambulatory Visit (HOSPITAL_BASED_OUTPATIENT_CLINIC_OR_DEPARTMENT_OTHER): Payer: Self-pay

## 2024-11-03 ENCOUNTER — Other Ambulatory Visit (HOSPITAL_BASED_OUTPATIENT_CLINIC_OR_DEPARTMENT_OTHER): Payer: Self-pay

## 2024-11-06 ENCOUNTER — Other Ambulatory Visit (HOSPITAL_BASED_OUTPATIENT_CLINIC_OR_DEPARTMENT_OTHER): Payer: Self-pay

## 2024-11-08 ENCOUNTER — Other Ambulatory Visit (HOSPITAL_BASED_OUTPATIENT_CLINIC_OR_DEPARTMENT_OTHER): Payer: Self-pay
